# Patient Record
Sex: Male | Born: 1985 | Race: White | Hispanic: No | Marital: Single | Smoking: Current every day smoker
Health system: Southern US, Community
[De-identification: ages and names within clinical notes are randomized; demographics above are authoritative.]

## PROBLEM LIST (undated history)

## (undated) DIAGNOSIS — R479 Unspecified speech disturbances: Secondary | ICD-10-CM

## (undated) HISTORY — PX: HERNIA REPAIR: SHX51

---

## 2015-01-09 ENCOUNTER — Encounter (HOSPITAL_COMMUNITY): Payer: Self-pay

## 2015-01-09 ENCOUNTER — Emergency Department (HOSPITAL_COMMUNITY): Payer: Self-pay

## 2015-01-09 ENCOUNTER — Emergency Department (HOSPITAL_COMMUNITY)
Admission: EM | Admit: 2015-01-09 | Discharge: 2015-01-09 | Discharge status: Against Medical Advice | Payer: Self-pay | Attending: Emergency Medicine | Admitting: Emergency Medicine

## 2015-01-09 DIAGNOSIS — Z720 Tobacco use: Secondary | ICD-10-CM | POA: Insufficient documentation

## 2015-01-09 DIAGNOSIS — R52 Pain, unspecified: Secondary | ICD-10-CM

## 2015-01-09 DIAGNOSIS — N5089 Other specified disorders of the male genital organs: Secondary | ICD-10-CM | POA: Insufficient documentation

## 2015-01-09 LAB — URINALYSIS, ROUTINE W REFLEX MICROSCOPIC
Bilirubin Urine: NEGATIVE
GLUCOSE, UA: NEGATIVE mg/dL
Hgb urine dipstick: NEGATIVE
KETONES UR: NEGATIVE mg/dL
LEUKOCYTES UA: NEGATIVE
NITRITE: NEGATIVE
PH: 6.5 (ref 5.0–8.0)
Protein, ur: NEGATIVE mg/dL
SPECIFIC GRAVITY, URINE: 1.02 (ref 1.005–1.030)
Urobilinogen, UA: 1 mg/dL (ref 0.0–1.0)

## 2015-01-09 NOTE — ED Notes (Signed)
This nurse and Majel Homer, RN in room during assessment of pt

## 2015-01-09 NOTE — ED Notes (Signed)
Pt states that he ride will have to leave at 2200

## 2015-01-09 NOTE — ED Provider Notes (Signed)
CSN: [id number]     Arrival date & time 01/09/15  1913 History   First MD Initiated Contact with Patient 01/09/15 1935     Chief Complaint  Patient presents with  . Testicle Pain     HPI Pt was seen at 1935. Per pt, c/o gradual onset and persistence of constant scrotal "pain" for the past 2 days. Pt states he "feels a bump" in his lower scrotal area. Denies injury, no open wounds, no dysuria/hematuria, no rash, no fevers, no abd pain, no back pain.    History reviewed. No pertinent past medical history.   Past Surgical History  Procedure Laterality Date  . Hernia repair      Social History  Substance Use Topics  . Smoking status: Current Some Day Smoker -- 0.50 packs/day    Types: Cigarettes  . Smokeless tobacco: None  . Alcohol Use: No    Review of Systems ROS: Statement: All systems negative except as marked or noted in the HPI; Constitutional: Negative for fever and chills. ; ; Eyes: Negative for eye pain, redness and discharge. ; ; ENMT: Negative for ear pain, hoarseness, nasal congestion, sinus pressure and sore throat. ; ; Cardiovascular: Negative for chest pain, palpitations, diaphoresis, dyspnea and peripheral edema. ; ; Respiratory: Negative for cough, wheezing and stridor. ; ; Gastrointestinal: Negative for nausea, vomiting, diarrhea, abdominal pain, blood in stool, hematemesis, jaundice and rectal bleeding. . ; ; Genitourinary: Negative for dysuria, flank pain and hematuria. ; ; Genital:  No penile drainage or rash, no testicular pain or swelling, no scrotal rash, +scrotal pain and swelling. ;; Musculoskeletal: Negative for back pain and neck pain. Negative for swelling and trauma.; ; Skin: Negative for pruritus, rash, abrasions, blisters, bruising and skin lesion.; ; Neuro: Negative for headache, lightheadedness and neck stiffness. Negative for weakness, altered level of consciousness , altered mental status, extremity weakness, paresthesias, involuntary movement, seizure and  syncope.      Allergies  Review of patient's allergies indicates no known allergies.  Home Medications   Prior to Admission medications   Not on File   BP 131/69 mmHg  Pulse 109  Temp(Src) 100.6 F (38.1 C) (Oral)  Resp 16  Ht  (1.854 m)  Wt 245 lb 9 oz (111.386 kg)  BMI 32.40 kg/m2  SpO2 96%  BP 118/63 mmHg  Pulse 99  Temp(Src) 98.4 F (36.9 C) (Oral)  Resp 18  Ht  (1.854 m)  Wt 245 lb 9 oz (111.386 kg)  BMI 32.40 kg/m2  SpO2 93%   Physical Exam  1940: Physical examination:  Nursing notes reviewed; Vital signs and O2 SAT reviewed;  Constitutional: Well developed, Well nourished, Well hydrated, In no acute distress; Head:  Normocephalic, atraumatic; Eyes: EOMI, PERRL, No scleral icterus; ENMT: Mouth and pharynx normal, Mucous membranes moist; Neck: Supple, Full range of motion, No lymphadenopathy; Cardiovascular: Regular rate and rhythm, No murmur, rub, or gallop; Respiratory: Breath sounds clear & equal bilaterally, No rales, rhonchi, wheezes.  Speaking full sentences with ease, Normal respiratory effort/excursion; Chest: Nontender, Movement normal; Abdomen: Soft, Nontender, Nondistended, Normal bowel sounds; Genitourinary: No CVA tenderness. Genital exam performed with pt permission and male ED Tech chaperone present during exam.  No perineal erythema, ecchymosis, or soft tissue crepitus. +small superficial flaccid blister to inner upper right thigh, no drainage.  No penile lesions or drainage.  No scrotal erythema. +approximately 4x4cm diameter tender hard non-fluctuant mass palp at lower-posterior scrotum, no erythema, no ecchymosis, no central pointing area, no  open wounds, no blisters/vesicles. Normal testicular lie.  No testicular tenderness to palp.  +cremasteric reflexes bilat.  No inguinal LAN or palpable masses.;; Extremities: Pulses normal, No tenderness, No edema, No calf edema or asymmetry.; Neuro: AA&Ox3, Major CN grossly intact.  Speech clear. No gross  focal motor or sensory deficits in extremities. Climbs on and off stretcher easily by himself. Gait steady.; Skin: Color normal, Warm, Dry.   ED Course  Procedures (including critical care time) Labs Review   Imaging Review  I have personally reviewed and evaluated these images and lab results as part of my medical decision-making.   EKG Interpretation None      MDM  MDM Reviewed: previous chart, nursing note and vitals Interpretation: labs and ultrasound      2135: Pt refuses to stay for [place], stating his "ride is about to leave." ED RN and I both encouraged pt to stay for scrotal US; pt refuses to stay.  Pt makes his own medical decisions.  Risks of AMA explained to pt, including, but not limited to:  Testicular mass/cancer, testicular torsion, scrotal mass, stroke, heart attack, cardiac arrythmia ("irregular heart rate/beat"), "passing out," temporary and/or permanent disability, death.  Pt and family verb understanding and continue to refuse admission, understanding the consequences of his decision.  I encouraged pt to follow up with his PMD tomorrow and return to the ED immediately if symptoms worsen, he changes his mind, or for any other concerns.  Pt verb understanding, agreeable.     Samuel Jester, DO 01/13/15 1745

## 2015-01-09 NOTE — ED Notes (Signed)
Patient c/o testicular pain X2 days.

## 2015-01-09 NOTE — ED Notes (Signed)
Pt left due to not being able to wait for exam results.  Dr. Clarene Duke made aware and pt to leave AMA. Pt left with steady gait and stated no pain at present and will come back if it gets worse.

## 2015-01-13 LAB — GC/CHLAMYDIA PROBE AMP (~~LOC~~) NOT AT ARMC
CHLAMYDIA, DNA PROBE: NEGATIVE
NEISSERIA GONORRHEA: NEGATIVE

## 2017-03-06 ENCOUNTER — Other Ambulatory Visit: Payer: Self-pay

## 2017-03-06 ENCOUNTER — Emergency Department (HOSPITAL_COMMUNITY)
Admission: EM | Admit: 2017-03-06 | Discharge: 2017-03-06 | Disposition: A | Discharge status: Home / Self Care | Payer: Self-pay | Attending: Emergency Medicine | Admitting: Emergency Medicine

## 2017-03-06 ENCOUNTER — Encounter (HOSPITAL_COMMUNITY): Payer: Self-pay | Admitting: Emergency Medicine

## 2017-03-06 DIAGNOSIS — F17210 Nicotine dependence, cigarettes, uncomplicated: Secondary | ICD-10-CM | POA: Insufficient documentation

## 2017-03-06 DIAGNOSIS — L02811 Cutaneous abscess of head [any part, except face]: Secondary | ICD-10-CM | POA: Insufficient documentation

## 2017-03-06 DIAGNOSIS — L03811 Cellulitis of head [any part, except face]: Secondary | ICD-10-CM | POA: Insufficient documentation

## 2017-03-06 MED ORDER — SULFAMETHOXAZOLE-TRIMETHOPRIM 800-160 MG PO TABS
1.0000 | ORAL_TABLET | Freq: Once | ORAL | Status: AC
  Administered 2017-03-06: 1 via ORAL
  Filled 2017-03-06: qty 1

## 2017-03-06 MED ORDER — SULFAMETHOXAZOLE-TRIMETHOPRIM 800-160 MG PO TABS: 1.0000 | ORAL_TABLET | Freq: Two times a day (BID) | ORAL | 0 refills | Status: AC

## 2017-03-06 NOTE — ED Triage Notes (Signed)
Patient c/o abscess to right upper forehead x5 days. Patient states "It started as a pimple and I squeezed it." Patient does reports some drainage. Are now red with scabbed area. Patient reports pain radiating from forehead to back of head.

## 2017-03-06 NOTE — Discharge Instructions (Signed)
Take 1 tablet of Bactrim twice daily for the next 7 days.  Your first dose has been given in the emergency department.  Please take your next dose tomorrow morning.  Please return to the emergency department, urgent care, or if you were able to make an appointment with a primary care doctor, please follow-up for a recheck of your wound in the next 2-3 days.  Keep the area clean daily with warm soap and water.  You can apply warm compresses for 15-20 minutes up to 3-4 times a day to help the area drain.  For pain, you can also apply ice or cool compress, or take 600 mg of ibuprofen with food every 6-8 hours, or 650 mg of Tylenol every 6 hours for pain control.

## 2017-03-06 NOTE — ED Provider Notes (Signed)
[facility] EMERGENCY DEPARTMENT Provider Note   CSN: [id number] Arrival date & time: 03/06/17  1617     History   Chief Complaint Chief Complaint  Patient presents with  . Abscess    HPI Timothy Barrera is a 31 y.o. male who presents to the emergency department with a chief complaint of wound.  The patient reports that a "pimple" appeared on his right forehead approximately 5 days ago that he popped.  He reports that over the last 5 days that the area has gotten more swollen and a small amount of purulent drainage was noted from the area. The surrounding area also became increasingly red.  No fever or chills.  No history of DM.  No recent antibiotic use.  No treatment prior to arrival.  He also complains of pain to the right forehead that radiates across the top of the scalp.  He denies otalgia, neck pain, or stiffness.  He reports that he is currently unemployed and does not have a PCP or medical insurance.  He is a current every day 0.5 pack/day smoker.   The history is provided by the patient. No language interpreter was used.  Abscess  Location:  Head/neck Size:  0.4 cm Abscess quality: draining, induration, painful, redness and warmth   Duration:  5 days Pain details:    Quality:  Aching   Severity:  Moderate   Duration:  5 days   Timing:  Constant   Progression:  Worsening Chronicity:  New Context: not diabetes and not immunosuppression   Relieved by:  Nothing Worsened by:  Nothing Associated symptoms: no fever     History reviewed. No pertinent past medical history.  There are no active problems to display for this patient.   Past Surgical History:  Procedure Laterality Date  . HERNIA REPAIR         Home Medications    Prior to Admission medications   Medication Sig Start Date End Date Taking? Authorizing Provider  sulfamethoxazole-trimethoprim (BACTRIM DS,SEPTRA DS) 800-160 MG tablet Take 1 tablet by mouth 2 (two) times daily for 7 days. 03/06/17  03/13/17  Darnice Comrie, Coral ElseMia A, PA-C    Family History History reviewed. No pertinent family history.  Social History Social History   Tobacco Use  . Smoking status: Current Some Day Smoker    Packs/day: 0.50    Types: Cigarettes  . Smokeless tobacco: Never Used  Substance Use Topics  . Alcohol use: No  . Drug use: No     Allergies   Patient has no known allergies.   Review of Systems Review of Systems  Constitutional: Negative for chills and fever.  Skin: Positive for color change and wound.     Physical Exam Updated Vital Signs BP (!) 159/91 (BP Location: Left Arm)   Pulse 99   Temp 98.5 F (36.9 C) (Oral)   Resp 18   Wt 112.5 kg (248 lb)   SpO2 94%   BMI 32.72 kg/m   Physical Exam  Constitutional: He appears well-developed.  HENT:  Head: Normocephalic.  There is an approximately 0.4 cm area of induration with minimal fluctuance that is surrounded in a circular area by redness and mild warmth.  Please see picture below.  No periorbital involvement.  Minimal purulent drainage is actively expressed.  Eyes: Conjunctivae are normal.  Neck: Neck supple.  Cardiovascular: Normal rate and regular rhythm.  No murmur heard. Pulmonary/Chest: Effort normal.  Abdominal: Soft. He exhibits no distension.  Neurological: He is alert.  Skin:  Skin is warm and dry.  Psychiatric: His behavior is normal.  Nursing note and vitals reviewed.      ED Treatments / Results  Labs (all labs ordered are listed, but only abnormal results are displayed) Labs Reviewed - No data to display  EKG  EKG Interpretation None       Radiology No results found.  Procedures Procedures (including critical care time)  Medications Ordered in ED Medications  sulfamethoxazole-trimethoprim (BACTRIM DS,SEPTRA DS) 800-160 MG per tablet 1 tablet (not administered)     Initial Impression / Assessment and Plan / ED Course  I have reviewed the triage vital signs and the nursing  notes.  Pertinent labs & imaging results that were available during my care of the patient were reviewed by me and considered in my medical decision making (see chart for details).     Patient with skin abscess and surrounding cellulitis. The wound has already began to actively drain so will defer I&D at this time.   Encouraged home warm soaks and flushing.  The patient does not currently have medical insurance or PCP and is concerned about the cost of the antibiotic.  Will send the patient home with a week course of Bactrim and a good Rx coupon to ensure that the patient will be compliant with antibiotics as he said he can afford the cost of this medication.  Wound recheck in 2 days in the ED since he does not have a PCP.Will d/c to home.  Strict return precautions given.  No acute distress.  The patient is safe for discharge at this time.   Final Clinical Impressions(s) / ED Diagnoses   Final diagnoses:  Cellulitis and abscess of head    ED Discharge Orders        Ordered    sulfamethoxazole-trimethoprim (BACTRIM DS,SEPTRA DS) 800-160 MG tablet  2 times daily     03/06/17 1652       Loxley Cibrian, Coral ElseMia A, PA-C 03/06/17 1700    Mesner, Barbara CowerJason, MD 03/06/17 2328

## 2017-04-15 DIAGNOSIS — R1319 Other dysphagia: Secondary | ICD-10-CM | POA: Insufficient documentation

## 2017-04-15 DIAGNOSIS — F17210 Nicotine dependence, cigarettes, uncomplicated: Secondary | ICD-10-CM | POA: Insufficient documentation

## 2017-04-16 ENCOUNTER — Emergency Department (HOSPITAL_COMMUNITY)
Admission: EM | Admit: 2017-04-16 | Discharge: 2017-04-16 | Disposition: A | Discharge status: Home / Self Care | Payer: Self-pay | Attending: Emergency Medicine | Admitting: Emergency Medicine

## 2017-04-16 ENCOUNTER — Emergency Department (HOSPITAL_COMMUNITY): Payer: Self-pay

## 2017-04-16 ENCOUNTER — Other Ambulatory Visit: Payer: Self-pay

## 2017-04-16 ENCOUNTER — Encounter (HOSPITAL_COMMUNITY): Payer: Self-pay | Admitting: *Deleted

## 2017-04-16 DIAGNOSIS — R1319 Other dysphagia: Secondary | ICD-10-CM

## 2017-04-16 MED ORDER — GI COCKTAIL ~~LOC~~
30.0000 mL | Freq: Once | ORAL | Status: AC
  Administered 2017-04-16: 30 mL via ORAL
  Filled 2017-04-16: qty 30

## 2017-04-16 NOTE — ED Notes (Signed)
Pt says it feels like the symptoms are subsided

## 2017-04-16 NOTE — ED Triage Notes (Signed)
Pt c/o feeling like something is stuck in his throat; pt has tried drinking and gagging with no results

## 2017-04-16 NOTE — ED Notes (Signed)
Gave patient a cup of water, patient drank the entire cup without cough, spitting,  or noting of any difficulty swallowing. The patient says he feels like the water is getting stuck.

## 2017-04-16 NOTE — ED Provider Notes (Signed)
[facility] EMERGENCY DEPARTMENT Provider Note   CSN: [id number] Arrival date & time: 04/15/17  2359     History   Chief Complaint Chief Complaint  Patient presents with  . Airway Obstruction    HPI Timothy Barrera is a 32 y.o. male.  The history is provided by the patient.  Illness  This is a new problem. The current episode started 6 to 12 hours ago. The problem occurs constantly. The problem has not changed since onset.Pertinent negatives include no chest pain and no shortness of breath. The symptoms are aggravated by swallowing. Nothing relieves the symptoms. He has tried nothing for the symptoms.  Patient reports eating a dinner that was consistent of burritos, and then several hours later had chicken potatoes and apple pie and soon after noted that he had difficulty swallowing He felt like something "stuck " However he is able to handle his secretions There is no fevers or vomiting, no neck pain He had otherwise been well prior to this episode   PMH-none Past Surgical History:  Procedure Laterality Date  . HERNIA REPAIR         Home Medications    Prior to Admission medications   Not on File    Family History History reviewed. No pertinent family history.  Social History Social History   Tobacco Use  . Smoking status: Current Some Day Smoker    Packs/day: 0.50    Types: Cigarettes  . Smokeless tobacco: Never Used  Substance Use Topics  . Alcohol use: No  . Drug use: No     Allergies   Patient has no known allergies.   Review of Systems Review of Systems  Constitutional: Negative for fever.  HENT: Positive for trouble swallowing. Negative for drooling.   Respiratory: Negative for shortness of breath.   Cardiovascular: Negative for chest pain.  Gastrointestinal: Negative for vomiting.  All other systems reviewed and are negative.    Physical Exam Updated Vital Signs BP 125/82   Pulse 88   Temp 98.7 F (37.1 C) (Oral)   Resp 16    Ht 1.854 m (6\' 1" )   Wt 102.1 kg (225 lb)   SpO2 94%   BMI 29.69 kg/m   Physical Exam CONSTITUTIONAL: Disheveled, no acute distress HEAD: Normocephalic/atraumatic EYES: EOMI/PERRL ENMT: Mucous membranes moist, nasal sounding voice noted but no stridor, no hot potato voice, no drooling noted, he is handling secretions well, uvula midline without erythema/exudates NECK: supple no meningeal signs, no crepitus to neck, no tenderness, no edema SPINE/BACK:entire spine nontender CV: S1/S2 noted, no murmurs/rubs/gallops noted LUNGS: Lungs are clear to auscultation bilaterally, no apparent distress ABDOMEN: soft NEURO: Pt is awake/alert/appropriate, moves all extremitiesx4.  No facial droop.   EXTREMITIES:  full ROM SKIN: warm, color normal PSYCH: no abnormalities of mood noted, alert and oriented to situation   ED Treatments / Results  Labs (all labs ordered are listed, but only abnormal results are displayed) Labs Reviewed - No data to display  EKG  EKG Interpretation None       Radiology Dg Neck Soft Tissue  Result Date: 04/16/2017 CLINICAL DATA:  Feels like something is stuck in the throat EXAM: NECK SOFT TISSUES - 1+ VIEW COMPARISON:  None. FINDINGS: There is no evidence of retropharyngeal soft tissue swelling or epiglottic enlargement. The cervical airway is unremarkable and no radio-opaque foreign body identified. IMPRESSION: Negative. Electronically Signed   By: Jasmine Pang M.D.   On: 04/16/2017 01:17    Procedures Procedures (including critical  care time)  Medications Ordered in ED Medications  gi cocktail (Maalox,Lidocaine,Donnatal) (30 mLs Oral Given 04/16/17 0329)     Initial Impression / Assessment and Plan / ED Course  I have reviewed the triage vital signs and the nursing notes.  Pertinent  imaging results that were available during my care of the patient were reviewed by me and considered in my medical decision making (see chart for details).     4:22  AM Patient monitored, no distress, was able to drink fluids without any difficulty He reports feeling improved.  He reports his voice sounds normal for him At this point there are no signs of acute esophageal obstruction Refer to GI We discussed strict ER return precautions  Final Clinical Impressions(s) / ED Diagnoses   Final diagnoses:  Other dysphagia    ED Discharge Orders    None       Zadie RhineWickline, Chloris Marcoux, MD 04/16/17 [phone]

## 2017-04-19 ENCOUNTER — Emergency Department (HOSPITAL_COMMUNITY)
Admission: EM | Admit: 2017-04-19 | Discharge: 2017-04-19 | Disposition: A | Discharge status: Home / Self Care | Payer: Self-pay

## 2017-04-19 NOTE — ED Notes (Signed)
Called x1, no answer

## 2017-04-19 NOTE — ED Notes (Signed)
Called x2

## 2017-04-20 ENCOUNTER — Other Ambulatory Visit: Payer: Self-pay

## 2017-04-20 ENCOUNTER — Emergency Department (HOSPITAL_COMMUNITY): Payer: Self-pay

## 2017-04-20 ENCOUNTER — Emergency Department (HOSPITAL_COMMUNITY)
Admission: EM | Admit: 2017-04-20 | Discharge: 2017-04-21 | Disposition: A | Discharge status: Home / Self Care | Payer: Self-pay | Attending: Emergency Medicine | Admitting: Emergency Medicine

## 2017-04-20 ENCOUNTER — Encounter (HOSPITAL_COMMUNITY): Payer: Self-pay | Admitting: Emergency Medicine

## 2017-04-20 DIAGNOSIS — F17210 Nicotine dependence, cigarettes, uncomplicated: Secondary | ICD-10-CM | POA: Insufficient documentation

## 2017-04-20 DIAGNOSIS — R0989 Other specified symptoms and signs involving the circulatory and respiratory systems: Secondary | ICD-10-CM | POA: Insufficient documentation

## 2017-04-20 LAB — BASIC METABOLIC PANEL
Anion gap: 12 (ref 5–15)
BUN: 9 mg/dL (ref 6–20)
CHLORIDE: 100 mmol/L — AB (ref 101–111)
CO2: 28 mmol/L (ref 22–32)
Calcium: 9.6 mg/dL (ref 8.9–10.3)
Creatinine, Ser: 0.62 mg/dL (ref 0.61–1.24)
GFR calc Af Amer: >60 mL/min (ref >60)
GFR calc non Af Amer: >60 mL/min (ref >60)
GLUCOSE: 86 mg/dL (ref 65–99)
POTASSIUM: 4.3 mmol/L (ref 3.5–5.1)
Sodium: 140 mmol/L (ref 135–145)

## 2017-04-20 LAB — CBC WITH DIFFERENTIAL/PLATELET
Basophils Absolute: 0 10*3/uL (ref 0.0–0.1)
Basophils Relative: 0 %
EOS PCT: 1 %
Eosinophils Absolute: 0.1 10*3/uL (ref 0.0–0.7)
HCT: 49.8 % (ref 39.0–52.0)
Hemoglobin: 16.7 g/dL (ref 13.0–17.0)
LYMPHS ABS: 1.6 10*3/uL (ref 0.7–4.0)
LYMPHS PCT: 22 %
MCH: 29.8 pg (ref 26.0–34.0)
MCHC: 33.5 g/dL (ref 30.0–36.0)
MCV: 88.8 fL (ref 78.0–100.0)
MONO ABS: 0.5 10*3/uL (ref 0.1–1.0)
Monocytes Relative: 7 %
Neutro Abs: 5 10*3/uL (ref 1.7–7.7)
Neutrophils Relative %: 70 %
PLATELETS: 163 10*3/uL (ref 150–400)
RBC: 5.61 MIL/uL (ref 4.22–5.81)
RDW: 13.4 % (ref 11.5–15.5)
WBC: 7.2 10*3/uL (ref 4.0–10.5)

## 2017-04-20 MED ORDER — GI COCKTAIL ~~LOC~~
30.0000 mL | Freq: Once | ORAL | Status: AC
  Administered 2017-04-20: 30 mL via ORAL
  Filled 2017-04-20: qty 30

## 2017-04-20 MED ORDER — IOPAMIDOL (ISOVUE-300) INJECTION 61%
75.0000 mL | Freq: Once | INTRAVENOUS | Status: AC | PRN
  Administered 2017-04-20: 75 mL via INTRAVENOUS

## 2017-04-20 MED ORDER — RANITIDINE HCL 150 MG/10ML PO SYRP
150.0000 mg | ORAL_SOLUTION | Freq: Once | ORAL | Status: AC
  Administered 2017-04-21: 150 mg via ORAL
  Filled 2017-04-20 (×2): qty 10

## 2017-04-20 NOTE — ED Triage Notes (Signed)
Patient states he was seen here on the 12th for food lodged in his throat. Pt states his symptoms have not changed and he has no money to see a specialist.

## 2017-04-20 NOTE — ED Notes (Signed)
Patient transported to CT 

## 2017-04-20 NOTE — ED Provider Notes (Signed)
[facility] EMERGENCY DEPARTMENT Provider Note   CSN: [id number] Arrival date & time: 04/20/17  1632   History   Chief Complaint Chief Complaint  Patient presents with  . Anorexia    HPI Timothy Barrera is a 32 y.o. male who presents with foreign body sensation in the throat. PMH significant for speech impediment. He states that he has been having this problem for the past week. History is limited due to speech impediment and it difficult to understand the patient at times. He was originally seen on Jan 12th for possible food impaction. He developed difficulty swallowing after eating a meal and then felt like something was stuck. Plain films of the neck were unremarkable. He was able to tolerate PO. He went home and was afraid to eat but when he did he developed the sensation again. He denies pain, just the feeling of something stuck. He also had some epigastric pain but this has resolved. He came back to the ED on 1/15 but LWBS due to wait times. Tonight he tried to eat again and developed the sensation so came back to the ED. He reports not being able to follow up with GI because he doesn't have insurance.  HPI  History reviewed. No pertinent past medical history.  There are no active problems to display for this patient.   Past Surgical History:  Procedure Laterality Date  . HERNIA REPAIR         Home Medications    Prior to Admission medications   Not on File    Family History History reviewed. No pertinent family history.  Social History Social History   Tobacco Use  . Smoking status: Current Some Day Smoker    Packs/day: 0.50    Types: Cigarettes  . Smokeless tobacco: Never Used  Substance Use Topics  . Alcohol use: Yes  . Drug use: No     Allergies   Patient has no known allergies.   Review of Systems Review of Systems  Constitutional: Negative for fever.  HENT: Negative for drooling, trouble swallowing and voice change.        +FB sensation    Respiratory: Negative for shortness of breath.   Gastrointestinal: Positive for abdominal pain (resolved). Negative for nausea and vomiting.  All other systems reviewed and are negative.    Physical Exam Updated Vital Signs BP 139/88 (BP Location: Left Arm)   Pulse 80   Temp 97.7 F (36.5 C) (Oral)   Resp 16   Ht 6\' 1"  (1.854 m)   Wt 104.3 kg (230 lb)   SpO2 98%   BMI 30.34 kg/m   Physical Exam  Constitutional: He is oriented to person, place, and time. He appears well-developed and well-nourished. No distress.  No distress. Patient has a speech impediment at baseline  HENT:  Head: Normocephalic and atraumatic.  Mouth/Throat: Uvula is midline, oropharynx is clear and moist and mucous membranes are normal.  Eyes: Conjunctivae are normal. Pupils are equal, round, and reactive to light. Right eye exhibits no discharge. Left eye exhibits no discharge. No scleral icterus.  Neck: Normal range of motion.  Cardiovascular: Normal rate and regular rhythm. Exam reveals no gallop and no friction rub.  No murmur heard. Pulmonary/Chest: Effort normal and breath sounds normal. No stridor. No respiratory distress. He has no wheezes. He has no rales. He exhibits no tenderness.  Abdominal: He exhibits no distension.  Neurological: He is alert and oriented to person, place, and time.  Skin: Skin is warm and  dry.  Psychiatric: He has a normal mood and affect. His behavior is normal.  Nursing note and vitals reviewed.    ED Treatments / Results  Labs (all labs ordered are listed, but only abnormal results are displayed) Labs Reviewed  BASIC METABOLIC PANEL - Abnormal; Notable for the following components:      Result Value   Chloride 100 (*)    All other components within normal limits  CBC WITH DIFFERENTIAL/PLATELET    EKG  EKG Interpretation None       Radiology Ct Soft Tissue Neck W Contrast  Result Date: 04/20/2017 CLINICAL DATA:  32 y/o M; foreign body ingestion on  04/16/2017 with persistent symptoms and neck pain. EXAM: CT NECK WITH CONTRAST TECHNIQUE: Multidetector CT imaging of the neck was performed using the standard protocol following the bolus administration of intravenous contrast. CONTRAST:  75mL ISOVUE-300 IOPAMIDOL (ISOVUE-300) INJECTION 61% COMPARISON:  04/16/2017 neck radiographs FINDINGS: Pharynx and larynx: Normal. No mass or swelling. No foreign body identified. Salivary glands: No inflammation, mass, or stone. Thyroid: Normal. Lymph nodes: None enlarged or abnormal density. Vascular: Negative. Limited intracranial: Negative. Visualized orbits: Negative. Mastoids and visualized paranasal sinuses: Partial left mastoid air cell opacification. Otherwise negative. Skeleton: No acute or aggressive process. Upper chest: Negative. Other: None. IMPRESSION: No foreign body identified in the aerodigestive tract. No significant inflammatory changes or exophytic mass. Unremarkable CT of the neck. Electronically Signed   By: Mitzi HansenLance  Furusawa-Stratton M.D.   On: 04/20/2017 23:42    Procedures Procedures (including critical care time)  Medications Ordered in ED Medications  gi cocktail (Maalox,Lidocaine,Donnatal) (30 mLs Oral Given 04/20/17 2248)  ranitidine (ZANTAC) 150 MG/10ML syrup 150 mg (150 mg Oral Given 04/21/17 0010)  iopamidol (ISOVUE-300) 61 % injection 75 mL (75 mLs Intravenous Contrast Given 04/20/17 2319)     Initial Impression / Assessment and Plan / ED Course  I have reviewed the triage vital signs and the nursing notes.  Pertinent labs & imaging results that were available during my care of the patient were reviewed by me and considered in my medical decision making (see chart for details).  32 year old male with foreign body sensation in the throat.  Unclear etiology.  Vital signs are normal.  He can talk without difficulty although he does have a speech impediment at baseline.  Labs are normal.  CT is normal.  He is given a GI cocktail and  Zantac once again which improved his symptoms however he is afraid that it will come back and he is anxious about a definitive diagnosis.  I advised him to attempt a trial of over-the-counter acid reflux medicine for the next week and to call GI to set up an appointment and request financial assistance.  Final Clinical Impressions(s) / ED Diagnoses   Final diagnoses:  Foreign body sensation in throat    ED Discharge Orders    None       Bethel BornGekas, Imogine Carvell Marie, PA-C 04/21/17 [id number]    Bethann BerkshireZammit, Joseph, MD 04/21/17 [id number]

## 2017-04-20 NOTE — ED Notes (Signed)
Pt states he wants to eat and drink fluids but can't due to the feeling that something is stuck in his throat. Pt states this began this afternoon after he ate lunch.

## 2017-04-21 NOTE — Discharge Instructions (Signed)
Please follow up with GI Take an over the counter acid reflux medicine for the next week. Talk to the pharmacist to help you find one that is in liquid form

## 2017-04-24 ENCOUNTER — Emergency Department (HOSPITAL_COMMUNITY)
Admission: EM | Admit: 2017-04-24 | Discharge: 2017-04-24 | Disposition: A | Discharge status: Home / Self Care | Payer: Self-pay | Attending: Emergency Medicine | Admitting: Emergency Medicine

## 2017-04-24 ENCOUNTER — Encounter (HOSPITAL_COMMUNITY): Payer: Self-pay | Admitting: *Deleted

## 2017-04-24 DIAGNOSIS — R198 Other specified symptoms and signs involving the digestive system and abdomen: Secondary | ICD-10-CM

## 2017-04-24 DIAGNOSIS — R0989 Other specified symptoms and signs involving the circulatory and respiratory systems: Secondary | ICD-10-CM | POA: Insufficient documentation

## 2017-04-24 DIAGNOSIS — K228 Other specified diseases of esophagus: Secondary | ICD-10-CM

## 2017-04-24 DIAGNOSIS — F17210 Nicotine dependence, cigarettes, uncomplicated: Secondary | ICD-10-CM | POA: Insufficient documentation

## 2017-04-24 MED ORDER — LORAZEPAM 1 MG PO TABS
1.0000 mg | ORAL_TABLET | Freq: Once | ORAL | Status: DC
  Filled 2017-04-24: qty 1

## 2017-04-24 MED ORDER — PANTOPRAZOLE SODIUM 40 MG PO TBEC: 40.0000 mg | DELAYED_RELEASE_TABLET | Freq: Every day | ORAL | 0 refills | Status: DC

## 2017-04-24 MED ORDER — GI COCKTAIL ~~LOC~~
30.0000 mL | Freq: Once | ORAL | Status: AC
  Administered 2017-04-24: 30 mL via ORAL
  Filled 2017-04-24: qty 30

## 2017-04-24 MED ORDER — PANTOPRAZOLE SODIUM 40 MG PO TBEC
40.0000 mg | DELAYED_RELEASE_TABLET | Freq: Once | ORAL | Status: DC
  Filled 2017-04-24: qty 1

## 2017-04-24 NOTE — ED Provider Notes (Signed)
[facility] Provider Note   CSN: [id number] Arrival date & time: 04/24/17  0400     History   Chief Complaint Chief Complaint  Patient presents with  . Foreign Body    HPI Timothy Barrera is a 32 y.o. male.  The history is provided by the patient.  Foreign Body   He had onset about 1 hour ago of a foreign body sensation in his throat.  He states that when he coughed up some mucus, it felt like the foreign body moved.  He is able to swallow.  He denies swallowing anything.  He had been seen in emergency department twice in the last week for similar complaints and was referred to gastroenterology.  He states he is not had an opportunity to call for a follow-up appointment, but does state that the symptoms had gone away.  He says tonight's episode feels different.  History reviewed. No pertinent past medical history.  There are no active problems to display for this patient.   Past Surgical History:  Procedure Laterality Date  . HERNIA REPAIR         Home Medications    Prior to Admission medications   Not on File    Family History No family history on file.  Social History Social History   Tobacco Use  . Smoking status: Current Some Day Smoker    Packs/day: 0.50    Types: Cigarettes  . Smokeless tobacco: Never Used  Substance Use Topics  . Alcohol use: Yes  . Drug use: No     Allergies   Patient has no known allergies.   Review of Systems Review of Systems  All other systems reviewed and are negative.    Physical Exam Updated Vital Signs BP 132/90   Pulse (!) 108   Temp 97.7 F (36.5 C) (Oral)   Resp 17   Ht 6\' 1"  (1.854 m)   Wt 104.3 kg (230 lb)   SpO2 97%   BMI 30.34 kg/m   Physical Exam  Nursing note and vitals reviewed.  32 year old male, resting comfortably and in no acute distress. Vital signs are normal. Oxygen saturation is 97%, which is normal. Head is normocephalic and atraumatic. PERRLA, EOMI.  Oropharynx is clear.  There is no pooling of secretions. Neck is nontender and supple without adenopathy or JVD.  There is no stridor. Back is nontender and there is no CVA tenderness. Lungs are clear without rales, wheezes, or rhonchi. Chest is nontender. Heart has regular rate and rhythm without murmur. Abdomen is soft, flat, nontender without masses or hepatosplenomegaly and peristalsis is normoactive. Extremities have no cyanosis or edema, full range of motion is present. Skin is warm and dry without rash. 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  ED Treatments / Results   Procedures Procedures (including critical care time)  Medications Ordered in ED Medications  pantoprazole (PROTONIX) EC tablet 40 mg (not administered)  LORazepam (ATIVAN) tablet 1 mg (not administered)  gi cocktail (Maalox,Lidocaine,Donnatal) (30 mLs Oral Given 04/24/17 0432)     Initial Impression / Assessment and Plan / ED Course  I have reviewed the triage vital signs and the nursing notes.  Foreign body sensation in throat.  Old records are reviewed, and he was seen on January 12 and January 16 for similar complaints and had negative plain  films of his neck and negative CT scan of his neck.  On each occasion, he was referred to gastroenterology.  At this point, I do not see indication for additional imaging.  He will be given a GI cocktail and reassessed.  He claims no relief from the GI cocktail.  However, he was able to swallow it without difficulty.  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  However, esophageal web or ring could cause similar symptoms.  I have encouraged him to make a follow-up appointment with gastroenterology to be considered for upper endoscopy.  In the meantime, he is discharged with prescription for  pantoprazole.  He is given a dose of pantoprazole here as well as a single dose of lorazepam.  Final Clinical Impressions(s) / ED Diagnoses   Final diagnoses:  Sensation of foreign body in esophagus    ED Discharge Orders        Ordered    pantoprazole (PROTONIX) 40 MG tablet  Daily     04/24/17 0511       Dione Booze, MD 04/24/17 [phone]

## 2017-04-24 NOTE — ED Notes (Signed)
ED Provider at bedside. 

## 2017-04-24 NOTE — Discharge Instructions (Signed)
Call the gastroenterologist tomorrow for an appointment as soon as possible. He can use an instrument to look down your esophagus to see why you keep having problems.

## 2017-04-24 NOTE — ED Notes (Signed)
Pt says he did not get the OTC reflux meds prescribed and has not yet had time to call the GI specialist.

## 2017-04-24 NOTE — ED Triage Notes (Signed)
Pt states he feels like he has something stuck in his throat. He has been here for the same previously, but this time he says "it feels like it is moving and causing sharp pain", started 30 minutes ago.

## 2017-05-12 ENCOUNTER — Emergency Department (HOSPITAL_COMMUNITY)
Admission: EM | Admit: 2017-05-12 | Discharge: 2017-05-12 | Disposition: A | Discharge status: Home / Self Care | Payer: Self-pay | Attending: Emergency Medicine | Admitting: Emergency Medicine

## 2017-05-12 ENCOUNTER — Other Ambulatory Visit: Payer: Self-pay

## 2017-05-12 ENCOUNTER — Encounter (HOSPITAL_COMMUNITY): Payer: Self-pay | Admitting: Emergency Medicine

## 2017-05-12 DIAGNOSIS — Z5321 Procedure and treatment not carried out due to patient leaving prior to being seen by health care provider: Secondary | ICD-10-CM | POA: Insufficient documentation

## 2017-05-12 DIAGNOSIS — R2241 Localized swelling, mass and lump, right lower limb: Secondary | ICD-10-CM | POA: Insufficient documentation

## 2017-05-12 NOTE — ED Triage Notes (Signed)
Pt not in waiting area at this time.  

## 2017-05-12 NOTE — ED Triage Notes (Signed)
Patient c/o swelling in right leg and foot. Denies any known injury. Per patient swelling in legs bilaterally "a couple of days ago" but now just in right leg. Denies and shortness of breath, hx of gout or blood clots. Patient states numbness.

## 2017-05-13 ENCOUNTER — Emergency Department (HOSPITAL_COMMUNITY)
Admission: EM | Admit: 2017-05-13 | Discharge: 2017-05-14 | Disposition: A | Discharge status: Home / Self Care | Payer: Self-pay | Attending: Emergency Medicine | Admitting: Emergency Medicine

## 2017-05-13 ENCOUNTER — Encounter (HOSPITAL_COMMUNITY): Payer: Self-pay

## 2017-05-13 ENCOUNTER — Emergency Department (HOSPITAL_COMMUNITY): Payer: Self-pay

## 2017-05-13 DIAGNOSIS — F17210 Nicotine dependence, cigarettes, uncomplicated: Secondary | ICD-10-CM | POA: Insufficient documentation

## 2017-05-13 DIAGNOSIS — Z79899 Other long term (current) drug therapy: Secondary | ICD-10-CM | POA: Insufficient documentation

## 2017-05-13 DIAGNOSIS — R609 Edema, unspecified: Secondary | ICD-10-CM

## 2017-05-13 DIAGNOSIS — M79671 Pain in right foot: Secondary | ICD-10-CM | POA: Insufficient documentation

## 2017-05-13 DIAGNOSIS — M79672 Pain in left foot: Secondary | ICD-10-CM | POA: Insufficient documentation

## 2017-05-13 DIAGNOSIS — R2243 Localized swelling, mass and lump, lower limb, bilateral: Secondary | ICD-10-CM | POA: Insufficient documentation

## 2017-05-13 NOTE — ED Triage Notes (Signed)
Pt reports bilateral foot pain and swelling over the past 5 days

## 2017-05-13 NOTE — ED Notes (Signed)
05/14/2107 14:33  Attempted follow-up call, no answer.

## 2017-05-13 NOTE — ED Provider Notes (Signed)
[facility]   CSN: [id number] Arrival date & time: 05/13/17  2319     History   Chief Complaint Chief Complaint  Patient presents with  . Foot Pain    Bilateral/Swelling    HPI Timothy Barrera is a 32 y.o. male.  Patient complains Of bilateral foot pain and swelling for the past 5 days.  Denies any falls or injuries.  Pain is on the top of his feet and worse when he tries to ambulate.  Denies any leg pain or leg swelling.  No chest pain or shortness of breath.  No history of blood clots.  No recent immobilization or travel.  Patient has tried Tylenol without relief.  Denies any history of fever, chills, nausea or vomiting.  Denies ever having this problem before.  Does not take any regular medications.  States he has had a rash on his legs and feet for the past year which is unchanged.   The history is provided by the patient.  Foot Pain  Pertinent negatives include no chest pain, no abdominal pain, no headaches and no shortness of breath.    History reviewed. No pertinent past medical history.  There are no active problems to display for this patient.   Past Surgical History:  Procedure Laterality Date  . HERNIA REPAIR         Home Medications    Prior to Admission medications   Medication Sig Start Date End Date Taking? Authorizing Provider  pantoprazole (PROTONIX) 40 MG tablet Take 1 tablet (40 mg total) by mouth daily. 04/24/17   Dione BoozeGlick, David, MD    Family History No family history on file.  Social History Social History   Tobacco Use  . Smoking status: Current Some Day Smoker    Packs/day: 0.50    Types: Cigarettes  . Smokeless tobacco: Never Used  Substance Use Topics  . Alcohol use: Yes  . Drug use: No     Allergies   Patient has no known allergies.   Review of Systems Review of Systems  Constitutional: Negative for activity change, appetite change and fever.  HENT: Negative for congestion and  rhinorrhea.   Eyes: Negative for visual disturbance.  Respiratory: Negative for chest tightness and shortness of breath.   Cardiovascular: Positive for leg swelling. Negative for chest pain.  Gastrointestinal: Negative for abdominal pain, nausea and vomiting.  Genitourinary: Negative for dysuria and hematuria.  Musculoskeletal: Positive for arthralgias, joint swelling and myalgias. Negative for back pain.  Skin: Positive for rash.  Neurological: Negative for dizziness, weakness and headaches.   all other systems are negative except as noted in the HPI and PMH.     Physical Exam Updated Vital Signs BP (!) 143/95 (BP Location: Right Arm)   Pulse 96   Temp 98.3 F (36.8 C) (Oral)   Resp 18   Ht 6\' 1"  (1.854 m)   Wt 105.2 kg (232 lb)   SpO2 93%   BMI 30.61 kg/m   Physical Exam  Constitutional: He is oriented to person, place, and time. He appears well-developed and well-nourished. No distress.  HENT:  Head: Normocephalic and atraumatic.  Mouth/Throat: Oropharynx is clear and moist. No oropharyngeal exudate.  Eyes: Conjunctivae and EOM are normal. Pupils are equal, round, and reactive to light.  Neck: Normal range of motion. Neck supple.  No meningismus.  Cardiovascular: Normal rate, regular rhythm, normal heart sounds and intact distal pulses.  No murmur heard. Pulmonary/Chest: Effort normal and breath sounds normal.  No respiratory distress.  Abdominal: Soft. There is no tenderness. There is no rebound and no guarding.  Musculoskeletal: Normal range of motion. He exhibits edema. He exhibits no tenderness.  Dorsal edema to both feet bilaterally.  Intact DP and PT pulses. Overlying skin is pale with scattered areas of brown rash appearing like petechiae. There is no calf tenderness or asymmetry Full range of motion of bilateral ankles, knees and toes  Neurological: He is alert and oriented to person, place, and time. No cranial nerve deficit. He exhibits normal muscle tone.  Coordination normal.  No ataxia on finger to nose bilaterally. No pronator drift. 5/5 strength throughout. CN 2-12 intact.Equal grip strength. Sensation intact.   Skin: Skin is warm.  Psychiatric: He has a normal mood and affect. His behavior is normal.  Nursing note and vitals reviewed.    ED Treatments / Results  Labs (all labs ordered are listed, but only abnormal results are displayed) Labs Reviewed  CBC WITH DIFFERENTIAL/PLATELET - Abnormal; Notable for the following components:      Result Value   Platelets 128 (*)    All other components within normal limits  COMPREHENSIVE METABOLIC PANEL - Abnormal; Notable for the following components:   Sodium 147 (*)    BUN 5 (*)    Creatinine, Ser 0.60 (*)    Total Protein 5.9 (*)    Albumin 3.3 (*)    AST 42 (*)    All other components within normal limits  URINALYSIS, ROUTINE W REFLEX MICROSCOPIC  BRAIN NATRIURETIC PEPTIDE  D-DIMER, QUANTITATIVE (NOT AT [facility])    EKG  EKG Interpretation None       Radiology Dg Chest 2 View  Result Date: 05/14/2017 CLINICAL DATA:  32 y/o  M; bilateral foot swelling and hypoxia. EXAM: CHEST  2 VIEW COMPARISON:  None. FINDINGS: The heart size and mediastinal contours are within normal limits. Both lungs are clear. The visualized skeletal structures are unremarkable. Elevated right hemidiaphragm. IMPRESSION: No acute pulmonary process identified. Elevated right hemidiaphragm. Electronically Signed   By: Mitzi Hansen M.D.   On: 05/14/2017 00:34    Procedures Procedures (including critical care time)  Medications Ordered in ED Medications - No data to display   Initial Impression / Assessment and Plan / ED Course  I have reviewed the triage vital signs and the nursing notes.  Pertinent labs & imaging results that were available during my care of the patient were reviewed by me and considered in my medical decision making (see chart for details).    5 days of bilateral feet pain  and swelling. No Trauma. No SOB or CP.  Feet are neurovascularly intact with good pulses.  Low suspicion for DVT given bilateral swelling and no calf tenderness or asymmetry.  Creatinine and LFTs are reassuring.  D-dimer is negative.  Chest x-ray is clear.  No evidence of heart failure or DVT.  No evidence of heart failure, DVT.  No proteinuria in urine.  No evidence of nephrotic syndrome.  The patient was given trial of Lasix for peripheral edema.  Instructed on leg elevation.  Needs to establish care with PCP.  Resource guide given.  Return precautions discussed.  Final Clinical Impressions(s) / ED Diagnoses   Final diagnoses:  Peripheral edema    ED Discharge Orders    None       Raylin Winer, Jeannett Senior, MD 05/14/17 [phone]

## 2017-05-13 NOTE — ED Notes (Signed)
05/13/2017, 15:07 Pt. Returned call.  Follow-up  Call completed.

## 2017-05-14 LAB — CBC WITH DIFFERENTIAL/PLATELET
Basophils Absolute: 0 10*3/uL (ref 0.0–0.1)
Basophils Relative: 0 %
Eosinophils Absolute: 0.2 10*3/uL (ref 0.0–0.7)
Eosinophils Relative: 3 %
HEMATOCRIT: 43.3 % (ref 39.0–52.0)
HEMOGLOBIN: 14 g/dL (ref 13.0–17.0)
LYMPHS ABS: 1.4 10*3/uL (ref 0.7–4.0)
LYMPHS PCT: 27 %
MCH: 29.4 pg (ref 26.0–34.0)
MCHC: 32.3 g/dL (ref 30.0–36.0)
MCV: 90.8 fL (ref 78.0–100.0)
MONOS PCT: 7 %
Monocytes Absolute: 0.3 10*3/uL (ref 0.1–1.0)
NEUTROS ABS: 3.2 10*3/uL (ref 1.7–7.7)
NEUTROS PCT: 63 %
Platelets: 128 10*3/uL — ABNORMAL LOW (ref 150–400)
RBC: 4.77 MIL/uL (ref 4.22–5.81)
RDW: 13 % (ref 11.5–15.5)
WBC: 5.1 10*3/uL (ref 4.0–10.5)

## 2017-05-14 LAB — COMPREHENSIVE METABOLIC PANEL
ALK PHOS: 82 U/L (ref 38–126)
ALT: 46 U/L (ref 17–63)
AST: 42 U/L — ABNORMAL HIGH (ref 15–41)
Albumin: 3.3 g/dL — ABNORMAL LOW (ref 3.5–5.0)
Anion gap: 9 (ref 5–15)
BILIRUBIN TOTAL: 0.8 mg/dL (ref 0.3–1.2)
BUN: 5 mg/dL — ABNORMAL LOW (ref 6–20)
CALCIUM: 8.9 mg/dL (ref 8.9–10.3)
CO2: 30 mmol/L (ref 22–32)
CREATININE: 0.6 mg/dL — AB (ref 0.61–1.24)
Chloride: 108 mmol/L (ref 101–111)
GFR calc non Af Amer: >60 mL/min (ref >60)
Glucose, Bld: 89 mg/dL (ref 65–99)
Potassium: 3.5 mmol/L (ref 3.5–5.1)
SODIUM: 147 mmol/L — AB (ref 135–145)
TOTAL PROTEIN: 5.9 g/dL — AB (ref 6.5–8.1)

## 2017-05-14 LAB — URINALYSIS, ROUTINE W REFLEX MICROSCOPIC
BILIRUBIN URINE: NEGATIVE
GLUCOSE, UA: NEGATIVE mg/dL
Hgb urine dipstick: NEGATIVE
KETONES UR: NEGATIVE mg/dL
Leukocytes, UA: NEGATIVE
Nitrite: NEGATIVE
PH: 8 (ref 5.0–8.0)
PROTEIN: NEGATIVE mg/dL
Specific Gravity, Urine: 1.015 (ref 1.005–1.030)

## 2017-05-14 LAB — D-DIMER, QUANTITATIVE: D-Dimer, Quant: <0.27 ug/mL-FEU (ref 0.00–0.50)

## 2017-05-14 LAB — BRAIN NATRIURETIC PEPTIDE: B Natriuretic Peptide: 39 pg/mL (ref 0.0–100.0)

## 2017-05-14 MED ORDER — FUROSEMIDE 20 MG PO TABS: 20.0000 mg | ORAL_TABLET | Freq: Every day | ORAL | 0 refills | Status: DC

## 2017-05-14 NOTE — Discharge Instructions (Signed)
There is no evidence of heart failure or blood clot.  Your kidney function and liver function are normal.  Your legs elevated.  Take the fluid pills as prescribed.  Follow-up with your primary doctor.  Return to the ED if you develop chest pain, shortness of breath or any other concerns.

## 2017-05-18 ENCOUNTER — Encounter (HOSPITAL_COMMUNITY): Payer: Self-pay | Admitting: Emergency Medicine

## 2017-05-18 ENCOUNTER — Emergency Department (HOSPITAL_COMMUNITY)
Admission: EM | Admit: 2017-05-18 | Discharge: 2017-05-19 | Disposition: A | Discharge status: Home / Self Care | Payer: Self-pay | Attending: Emergency Medicine | Admitting: Emergency Medicine

## 2017-05-18 ENCOUNTER — Other Ambulatory Visit: Payer: Self-pay

## 2017-05-18 DIAGNOSIS — F17210 Nicotine dependence, cigarettes, uncomplicated: Secondary | ICD-10-CM | POA: Insufficient documentation

## 2017-05-18 DIAGNOSIS — J029 Acute pharyngitis, unspecified: Secondary | ICD-10-CM

## 2017-05-18 DIAGNOSIS — J069 Acute upper respiratory infection, unspecified: Secondary | ICD-10-CM | POA: Insufficient documentation

## 2017-05-18 LAB — RAPID STREP SCREEN (MED CTR MEBANE ONLY): STREPTOCOCCUS, GROUP A SCREEN (DIRECT): NEGATIVE

## 2017-05-18 MED ORDER — IBUPROFEN 100 MG/5ML PO SUSP: 400.0000 mg | Freq: Once | ORAL | Status: DC

## 2017-05-18 MED ORDER — AMOXICILLIN 250 MG/5ML PO SUSR: 500.0000 mg | Freq: Once | ORAL | Status: DC

## 2017-05-18 MED ORDER — AMOXICILLIN 400 MG/5ML PO SUSR: 400.0000 mg | Freq: Three times a day (TID) | ORAL | 0 refills | Status: AC

## 2017-05-18 MED ORDER — IBUPROFEN 100 MG/5ML PO SUSP: 400.0000 mg | Freq: Four times a day (QID) | ORAL | 1 refills | Status: DC | PRN

## 2017-05-18 NOTE — ED Triage Notes (Signed)
Pt c/o sore throat

## 2017-05-18 NOTE — Discharge Instructions (Signed)
Please wash hands frequently.  Use salt water gargles.  Use Chloraseptic spray.  Use Amoxil 3 times daily, and use ibuprofen every 6 hours for fever and/or aching.

## 2017-05-18 NOTE — ED Provider Notes (Signed)
[facility] Provider [facility]   CSN: [id number] Arrival date & time: 05/18/17  2203     History   Chief Complaint Chief Complaint  Patient presents with  . Sore Throat    HPI Timothy Barrera is a 32 y.o. male.  The history is provided by the patient.  Sore Throat  This is a new problem. The current episode started yesterday. The problem has been gradually worsening. Pertinent negatives include no chest pain, no abdominal pain, no headaches and no shortness of breath. The symptoms are aggravated by swallowing. Nothing relieves the symptoms. Treatments tried: salt water.    History reviewed. No pertinent past medical history.  There are no active problems to display for this patient.   Past Surgical History:  Procedure Laterality Date  . HERNIA REPAIR         Home Medications    Prior to Admission medications   Medication Sig Start Date End Date Taking? Authorizing Provider  furosemide (LASIX) 20 MG tablet Take 1 tablet (20 mg total) by mouth daily. 05/14/17   Rancour, Jeannett SeniorStephen, MD  pantoprazole (PROTONIX) 40 MG tablet Take 1 tablet (40 mg total) by mouth daily. 04/24/17   Dione BoozeGlick, David, MD    Family History No family history on file.  Social History Social History   Tobacco Use  . Smoking status: Current Some Day Smoker    Packs/day: 0.50    Types: Cigarettes  . Smokeless tobacco: Never Used  Substance Use Topics  . Alcohol use: No    Frequency: Never  . Drug use: No     Allergies   Patient has no known allergies.   Review of Systems Review of Systems  Constitutional: Negative for activity change.       All ROS Neg except as noted in HPI  HENT: Positive for congestion and sore throat. Negative for nosebleeds.   Eyes: Negative for photophobia and discharge.  Respiratory: Negative for cough, shortness of breath and wheezing.   Cardiovascular: Negative for chest pain and palpitations.  Gastrointestinal: Negative for abdominal pain  and blood in stool.  Genitourinary: Negative for dysuria, frequency and hematuria.  Musculoskeletal: Positive for myalgias. Negative for arthralgias, back pain and neck pain.  Skin: Negative.   Neurological: Negative for dizziness, seizures, speech difficulty and headaches.  Psychiatric/Behavioral: Negative for confusion and hallucinations.     Physical Exam Updated Vital Signs BP (!) 141/79 (BP Location: Right Arm)   Pulse 83   Temp 98.7 F (37.1 C) (Oral)   Resp 18   Ht 6\' 1"  (1.854 m)   Wt 106.6 kg (235 lb)   SpO2 93%   BMI 31.00 kg/m   Physical Exam  Constitutional: He is oriented to person, place, and time. He appears well-developed and well-nourished.  Non-toxic appearance.  HENT:  Head: Normocephalic.  Right Ear: Tympanic membrane and external ear normal.  Left Ear: Tympanic membrane and external ear normal.  Mouth/Throat: Uvula swelling present. Posterior oropharyngeal erythema present. No tonsillar abscesses.  Eyes: EOM and lids are normal. Pupils are equal, round, and reactive to light.  Neck: Normal range of motion. Neck supple. Carotid bruit is not present.  Few cervical lymph nodes appreciated.  Cardiovascular: Normal rate, regular rhythm, normal heart sounds, intact distal pulses and normal pulses.  Pulmonary/Chest: Breath sounds normal. No respiratory distress.  Abdominal: Soft. Bowel sounds are normal. There is no tenderness. There is no guarding.  Musculoskeletal: Normal range of motion.  Lymphadenopathy:  Head (right side): No submandibular adenopathy present.       Head (left side): No submandibular adenopathy present.    He has no cervical adenopathy.  Neurological: He is alert and oriented to person, place, and time. He has normal strength. No cranial nerve deficit or sensory deficit.  Skin: Skin is warm and dry.  Psychiatric: He has a normal mood and affect. His speech is normal.  Nursing note and vitals reviewed.    ED Treatments / Results    Labs (all labs ordered are listed, but only abnormal results are displayed) Labs Reviewed  RAPID STREP SCREEN (NOT AT [facility])  CULTURE, GROUP A STREP [facility])    EKG  EKG Interpretation None       Radiology No results found.  Procedures Procedures (including critical care time)  Medications Ordered in ED Medications  ibuprofen (ADVIL,MOTRIN) 100 MG/5ML suspension 400 mg (not administered)  amoxicillin (AMOXIL) 250 MG/5ML suspension 500 mg (not administered)     Initial Impression / Assessment and Plan / ED Course  I have reviewed the triage vital signs and the nursing notes.  Pertinent labs & imaging results that were available during my care of the patient were reviewed by me and considered in my medical decision making (see chart for details).       Final Clinical Impressions(s) / ED Diagnoses MDM  Vital signs within normal limits.  Patient has some increased redness of the posterior pharynx and swelling of the uvula.  He states that he has had some chills.  There also a few palpable cervical nodes noted.  Patient will be treated with Amoxil and ibuprofen.  Patient will continue salt water gargles and will try Chloraseptic.  Patient is to return to the emergency department or see his primary physician if not improving.   Final diagnoses:  Pharyngitis, unspecified etiology  Upper respiratory tract infection, unspecified type    ED Discharge Orders        Ordered    amoxicillin (AMOXIL) 400 MG/5ML suspension  3 times daily     05/18/17 2356    ibuprofen (CHILD IBUPROFEN) 100 MG/5ML suspension  Every 6 hours PRN     05/18/17 2356       Ivery Quale, PA-C 05/19/17 0158    Dione Booze, MD 05/19/17 [phone]

## 2017-05-21 LAB — CULTURE, GROUP A STREP (THRC)

## 2017-08-01 ENCOUNTER — Other Ambulatory Visit: Payer: Self-pay

## 2017-08-01 ENCOUNTER — Emergency Department (HOSPITAL_COMMUNITY)
Admission: EM | Admit: 2017-08-01 | Discharge: 2017-08-01 | Disposition: A | Discharge status: Home / Self Care | Payer: Self-pay | Attending: Emergency Medicine | Admitting: Emergency Medicine

## 2017-08-01 ENCOUNTER — Encounter (HOSPITAL_COMMUNITY): Payer: Self-pay

## 2017-08-01 DIAGNOSIS — F17210 Nicotine dependence, cigarettes, uncomplicated: Secondary | ICD-10-CM | POA: Insufficient documentation

## 2017-08-01 DIAGNOSIS — Z79899 Other long term (current) drug therapy: Secondary | ICD-10-CM | POA: Insufficient documentation

## 2017-08-01 DIAGNOSIS — K122 Cellulitis and abscess of mouth: Secondary | ICD-10-CM | POA: Insufficient documentation

## 2017-08-01 LAB — GROUP A STREP BY PCR: Group A Strep by PCR: NOT DETECTED

## 2017-08-01 MED ORDER — AMOXICILLIN 400 MG/5ML PO SUSR: 500.0000 mg | Freq: Two times a day (BID) | ORAL | 0 refills | Status: DC

## 2017-08-01 MED ORDER — ACETAMINOPHEN 500 MG PO TABS
1000.0000 mg | ORAL_TABLET | Freq: Once | ORAL | Status: DC
  Filled 2017-08-01: qty 2

## 2017-08-01 NOTE — ED Provider Notes (Signed)
[facility] EMERGENCY DEPARTMENT Provider Note   CSN: [id number] Arrival date & time: 08/01/17  0840     History   Chief Complaint Chief Complaint  Patient presents with  . Sore Throat    HPI Timothy Barrera is a 32 y.o. male.  Patient with no significant medical problems presents with sore throat since last night. No fevers or vomiting. Pain is swallowing. No significant change in voice.patient prefers liquid medications normally.     History reviewed. No pertinent past medical history.  There are no active problems to display for this patient.   Past Surgical History:  Procedure Laterality Date  . HERNIA REPAIR          Home Medications    Prior to Admission medications   Medication Sig Start Date End Date Taking? Authorizing Provider  amoxicillin (AMOXIL) 400 MG/5ML suspension Take 6.3 mLs (500 mg total) by mouth 2 (two) times daily. 08/01/17   Blane Ohara, MD  furosemide (LASIX) 20 MG tablet Take 1 tablet (20 mg total) by mouth daily. 05/14/17   Rancour, Jeannett Senior, MD  ibuprofen (CHILD IBUPROFEN) 100 MG/5ML suspension Take 20 mLs (400 mg total) by mouth every 6 (six) hours as needed. 05/18/17   Ivery Quale, PA-C  pantoprazole (PROTONIX) 40 MG tablet Take 1 tablet (40 mg total) by mouth daily. 04/24/17   Dione Booze, MD    Family History No family history on file.  Social History Social History   Tobacco Use  . Smoking status: Current Some Day Smoker    Packs/day: 0.50    Types: Cigarettes  . Smokeless tobacco: Never Used  Substance Use Topics  . Alcohol use: No    Frequency: Never  . Drug use: No     Allergies   Patient has no known allergies.   Review of Systems Review of Systems  Constitutional: Negative for chills and fever.  HENT: Positive for congestion and sore throat.   Respiratory: Negative for shortness of breath.   Gastrointestinal: Negative for abdominal pain and vomiting.  Musculoskeletal: Negative for back pain, neck pain  and neck stiffness.  Skin: Negative for rash.  Neurological: Negative for light-headedness and headaches.     Physical Exam Updated Vital Signs BP 132/73 (BP Location: Left Arm)   Pulse 91   Temp 99.4 F (37.4 C) (Oral)   Resp 18   Ht  (1.854 m)   Wt 102.1 kg (225 lb)   SpO2 96%   BMI 29.69 kg/m   Physical Exam  Constitutional: He is oriented to person, place, and time. He appears well-developed and well-nourished.  HENT:  Head: Normocephalic and atraumatic.  Mild swelling of uvula, no signs of abscess, no drooling, neck supple, no trismus.  Eyes: Conjunctivae are normal. Right eye exhibits no discharge. Left eye exhibits no discharge.  Neck: Normal range of motion. Neck supple. No tracheal deviation present.  Cardiovascular: Normal rate and regular rhythm.  Pulmonary/Chest: Effort normal and breath sounds normal.  Abdominal: Soft. He exhibits no distension. There is no tenderness. There is no guarding.  Musculoskeletal: He exhibits no edema.  Neurological: He is alert and oriented to person, place, and time.  Skin: Skin is warm. No rash noted.  Psychiatric: He has a normal mood and affect.  Nursing note and vitals reviewed.    ED Treatments / Results  Labs (all labs ordered are listed, but only abnormal results are displayed) Labs Reviewed  GROUP A STREP BY PCR    EKG None  Radiology  No results found.  Procedures Procedures (including critical care time)  Medications Ordered in ED Medications  acetaminophen (TYLENOL) tablet 1,000 mg (1,000 mg Oral Refused 08/01/17 4098)     Initial Impression / Assessment and Plan / ED Course  I have reviewed the triage vital signs and the nursing notes.  Pertinent labs & imaging results that were available during my care of the patient were reviewed by me and considered in my medical decision making (see chart for details).    Patient presents with mild pharyngitis/uvulitis. Discussed follow-up culture,  amoxicillin and follow-up [facility]. Patient does not want pain medicines at this time.  Results and differential diagnosis were discussed with the patient/parent/guardian. Xrays were independently reviewed by myself.  Close follow up outpatient was discussed, comfortable with the plan.   Medications  acetaminophen (TYLENOL) tablet 1,000 mg (1,000 mg Oral Refused 08/01/17 0938)    Vitals:   08/01/17 0851  BP: 132/73  Pulse: 91  Resp: 18  Temp: 99.4 F (37.4 C)  TempSrc: Oral  SpO2: 96%  Weight: 102.1 kg (225 lb)  Height:  (1.854 m)    Final diagnoses:  Uvulitis     Final Clinical Impressions(s) / ED Diagnoses   Final diagnoses:  Uvulitis    ED Discharge Orders        Ordered    amoxicillin (AMOXIL) 400 MG/5ML suspension  2 times daily     08/01/17 1037       Blane Ohara, MD 08/01/17 1044

## 2017-08-01 NOTE — Discharge Instructions (Signed)
Use tylenol and motrin as needed every 6 hours for pain or fever. Take antibiotics as directed and follow-up culture results and for reassessment in 2-3 days. Come back to the ER if you have difficulty swallowing, difficulty breathing or worsening symptoms.

## 2017-08-01 NOTE — ED Triage Notes (Signed)
Pt c/o sore throat since yesterday 

## 2017-09-11 ENCOUNTER — Emergency Department (HOSPITAL_COMMUNITY)
Admission: EM | Admit: 2017-09-11 | Discharge: 2017-09-11 | Disposition: A | Discharge status: Home / Self Care | Payer: Self-pay | Attending: Emergency Medicine | Admitting: Emergency Medicine

## 2017-09-11 ENCOUNTER — Other Ambulatory Visit: Payer: Self-pay

## 2017-09-11 ENCOUNTER — Encounter (HOSPITAL_COMMUNITY): Payer: Self-pay | Admitting: *Deleted

## 2017-09-11 DIAGNOSIS — Z23 Encounter for immunization: Secondary | ICD-10-CM | POA: Insufficient documentation

## 2017-09-11 DIAGNOSIS — L0291 Cutaneous abscess, unspecified: Secondary | ICD-10-CM

## 2017-09-11 DIAGNOSIS — L02416 Cutaneous abscess of left lower limb: Secondary | ICD-10-CM | POA: Insufficient documentation

## 2017-09-11 DIAGNOSIS — Z79899 Other long term (current) drug therapy: Secondary | ICD-10-CM | POA: Insufficient documentation

## 2017-09-11 DIAGNOSIS — F17210 Nicotine dependence, cigarettes, uncomplicated: Secondary | ICD-10-CM | POA: Insufficient documentation

## 2017-09-11 MED ORDER — DOXYCYCLINE HYCLATE 100 MG PO CAPS: 100.0000 mg | ORAL_CAPSULE | Freq: Two times a day (BID) | ORAL | 0 refills | Status: DC

## 2017-09-11 MED ORDER — TETANUS-DIPHTH-ACELL PERTUSSIS 5-2.5-18.5 LF-MCG/0.5 IM SUSP
0.5000 mL | Freq: Once | INTRAMUSCULAR | Status: AC
  Administered 2017-09-11: 0.5 mL via INTRAMUSCULAR
  Filled 2017-09-11: qty 0.5

## 2017-09-11 MED ORDER — POVIDONE-IODINE 10 % OINT PACKET
TOPICAL_OINTMENT | CUTANEOUS | Status: AC
  Filled 2017-09-11: qty 2

## 2017-09-11 MED ORDER — DOXYCYCLINE HYCLATE 100 MG PO TABS
100.0000 mg | ORAL_TABLET | Freq: Once | ORAL | Status: AC
  Administered 2017-09-11: 100 mg via ORAL
  Filled 2017-09-11: qty 1

## 2017-09-11 MED ORDER — LIDOCAINE-EPINEPHRINE (PF) 2 %-1:200000 IJ SOLN
20.0000 mL | Freq: Once | INTRAMUSCULAR | Status: AC
  Administered 2017-09-11: 20 mL
  Filled 2017-09-11: qty 20

## 2017-09-11 NOTE — ED Triage Notes (Signed)
Pt states he was bit by some type of bug x 1 week ago to the left leg; pt has redness with dark area in the middle of the red area; pt states he has pain with walking;

## 2017-09-11 NOTE — ED Notes (Signed)
Dressing applied and wound care discussed, pt and family verbalized understanding

## 2017-09-11 NOTE — Discharge Instructions (Signed)
Take antibiotics as prescribed.  perform warm soaks twice daily as recommended.  Return to the ED in 2 days for wound check.  Return to the ED sooner if you develop worsening pain, redness, fever or any other concerns.

## 2017-09-11 NOTE — ED Provider Notes (Signed)
[facility] EMERGENCY DEPARTMENT Provider Note   CSN: [id number] Arrival date & time: 09/11/17  0127     History   Chief Complaint Chief Complaint  Patient presents with  . Insect Bite    HPI Timothy Barrera is a 32 y.o. male.  Patient presents with 1 week of redness and pain to his left lower leg.  Believes he was bitten with something after working in the garden a week ago.  Has had increasing redness and pain since.  No bleeding or drainage.  No fever.  Denies any focal weakness, numbness or tingling.  No chest pain or shortness of breath.   The history is provided by the patient.    History reviewed. No pertinent past medical history.  There are no active problems to display for this patient.   Past Surgical History:  Procedure Laterality Date  . HERNIA REPAIR          Home Medications    Prior to Admission medications   Medication Sig Start Date End Date Taking? Authorizing Provider  amoxicillin (AMOXIL) 400 MG/5ML suspension Take 6.3 mLs (500 mg total) by mouth 2 (two) times daily. 08/01/17   Blane Ohara, MD  furosemide (LASIX) 20 MG tablet Take 1 tablet (20 mg total) by mouth daily. 05/14/17   Oreatha Fabry, Jeannett Senior, MD  ibuprofen (CHILD IBUPROFEN) 100 MG/5ML suspension Take 20 mLs (400 mg total) by mouth every 6 (six) hours as needed. 05/18/17   Ivery Quale, PA-C  pantoprazole (PROTONIX) 40 MG tablet Take 1 tablet (40 mg total) by mouth daily. 04/24/17   Dione Booze, MD    Family History History reviewed. No pertinent family history.  Social History Social History   Tobacco Use  . Smoking status: Current Some Day Smoker    Packs/day: 0.50    Types: Cigarettes  . Smokeless tobacco: Never Used  Substance Use Topics  . Alcohol use: No    Frequency: Never  . Drug use: No     Allergies   Patient has no known allergies.   Review of Systems Review of Systems  Constitutional: Negative for activity change, appetite change and fever.  HENT: Positive  for congestion. Negative for mouth sores and nosebleeds.   Respiratory: Negative for cough, chest tightness and shortness of breath.   Cardiovascular: Negative for chest pain and palpitations.  Gastrointestinal: Negative for abdominal pain, nausea and vomiting.  Genitourinary: Negative for dysuria, hematuria, testicular pain and urgency.  Musculoskeletal: Negative for arthralgias and myalgias.  Skin: Positive for rash and wound.  Neurological: Negative for dizziness.    all other systems are negative except as noted in the HPI and PMH.    Physical Exam Updated Vital Signs BP (!) 139/96 (BP Location: Left Arm)   Pulse (!) 101   Temp 98.1 F (36.7 C) (Oral)   Resp 18   Ht 6\' 1"  (1.854 m)   Wt 99.8 kg (220 lb)   SpO2 92%   BMI 29.03 kg/m   Physical Exam  Constitutional: He is oriented to person, place, and time. He appears well-developed and well-nourished. No distress.  HENT:  Head: Normocephalic and atraumatic.  Mouth/Throat: Oropharynx is clear and moist. No oropharyngeal exudate.  Eyes: Pupils are equal, round, and reactive to light. Conjunctivae and EOM are normal.  Neck: Normal range of motion. Neck supple.  No meningismus.  Cardiovascular: Normal rate, regular rhythm, normal heart sounds and intact distal pulses.  No murmur heard. Pulmonary/Chest: Effort normal and breath sounds normal. No respiratory distress.  Abdominal: Soft. There is no tenderness. There is no rebound and no guarding.  Musculoskeletal: Normal range of motion. He exhibits tenderness. He exhibits no edema.  3 cm area of induration and erythema to  left lower leg.  There is no fluctuance.  Compartments are soft.  Intact DP and PT pulses.  Neurological: He is alert and oriented to person, place, and time. No cranial nerve deficit. He exhibits normal muscle tone. Coordination normal.   5/5 strength throughout. CN 2-12 intact.Equal grip strength.   Skin: Skin is warm.  Psychiatric: He has a normal mood  and affect. His behavior is normal.  Nursing note and vitals reviewed.    ED Treatments / Results  Labs (all labs ordered are listed, but only abnormal results are displayed) Labs Reviewed - No data to display  EKG None  Radiology No results found.  Procedures .Marland Kitchen.Incision and Drainage Date/Time: 09/11/2017 2:53 AM Performed by: Glynn Octaveancour, Dulse Rutan, MD Authorized by: Glynn Octaveancour, Breshay Ilg, MD   Consent:    Consent obtained:  Verbal   Consent given by:  Patient   Risks discussed:  Bleeding, incomplete drainage, pain and infection Location:    Type:  Abscess   Size:  2   Location:  Lower extremity   Lower extremity location:  Leg   Leg location:  L lower leg Pre-procedure details:    Skin preparation:  Betadine Anesthesia (see MAR for exact dosages):    Anesthesia method:  Local infiltration   Local anesthetic:  Lidocaine 2% WITH epi Procedure type:    Complexity:  Simple Procedure details:    Needle aspiration: no     Incision types:  Single straight   Incision depth:  Subcutaneous   Scalpel blade:  11   Wound management:  Probed and deloculated, irrigated with saline and extensive cleaning   Drainage:  Purulent   Drainage amount:  Moderate   Wound treatment:  Wound left open   Packing materials:  None Post-procedure details:    Patient tolerance of procedure:  Tolerated well, no immediate complications   (including critical care time)  Medications Ordered in ED Medications  lidocaine-EPINEPHrine (XYLOCAINE W/EPI) 2 %-1:200000 (PF) injection 20 mL (has no administration in time range)  Tdap (BOOSTRIX) injection 0.5 mL (has no administration in time range)     Initial Impression / Assessment and Plan / ED Course  I have reviewed the triage vital signs and the nursing notes.  Pertinent labs & imaging results that were available during my care of the patient were reviewed by me and considered in my medical decision making (see chart for details).    Patient with  cellulitis and abscess to the left lower leg.  Hemodynamically stable and afebrile.  Tetanus updated.  Incision and drainage performed as above.  Patient given wound care instructions, warm soaks twice daily, wound check in 2 days.  Antibiotic prescription given given surrounding cellulitis.  Return to ED sooner if redness spreads beyond current borders, fever, vomiting or other concerns. Final Clinical Impressions(s) / ED Diagnoses   Final diagnoses:  Abscess    ED Discharge Orders    None       Kerry Chisolm, Jeannett SeniorStephen, MD 09/11/17 [phone]

## 2017-09-12 ENCOUNTER — Emergency Department (HOSPITAL_COMMUNITY)
Admission: EM | Admit: 2017-09-12 | Discharge: 2017-09-12 | Disposition: A | Discharge status: Home / Self Care | Payer: Self-pay | Attending: Emergency Medicine | Admitting: Emergency Medicine

## 2017-09-12 ENCOUNTER — Encounter (HOSPITAL_COMMUNITY): Payer: Self-pay | Admitting: Emergency Medicine

## 2017-09-12 DIAGNOSIS — Z5321 Procedure and treatment not carried out due to patient leaving prior to being seen by health care provider: Secondary | ICD-10-CM | POA: Insufficient documentation

## 2017-09-12 DIAGNOSIS — L03116 Cellulitis of left lower limb: Secondary | ICD-10-CM

## 2017-09-12 DIAGNOSIS — L02416 Cutaneous abscess of left lower limb: Secondary | ICD-10-CM | POA: Insufficient documentation

## 2017-09-12 LAB — CBC WITH DIFFERENTIAL/PLATELET
Basophils Absolute: 0 10*3/uL (ref 0.0–0.1)
Basophils Relative: 0 %
EOS ABS: 0.1 10*3/uL (ref 0.0–0.7)
EOS PCT: 2 %
HCT: 51.1 % (ref 39.0–52.0)
Hemoglobin: 16.8 g/dL (ref 13.0–17.0)
LYMPHS ABS: 0.8 10*3/uL (ref 0.7–4.0)
LYMPHS PCT: 14 %
MCH: 29.5 pg (ref 26.0–34.0)
MCHC: 32.9 g/dL (ref 30.0–36.0)
MCV: 89.8 fL (ref 78.0–100.0)
Monocytes Absolute: 0.3 10*3/uL (ref 0.1–1.0)
Monocytes Relative: 6 %
Neutro Abs: 4.4 10*3/uL (ref 1.7–7.7)
Neutrophils Relative %: 78 %
PLATELETS: 139 10*3/uL — AB (ref 150–400)
RBC: 5.69 MIL/uL (ref 4.22–5.81)
RDW: 13.3 % (ref 11.5–15.5)
WBC: 5.6 10*3/uL (ref 4.0–10.5)

## 2017-09-12 LAB — BASIC METABOLIC PANEL
Anion gap: 9 (ref 5–15)
BUN: 8 mg/dL (ref 6–20)
CO2: 31 mmol/L (ref 22–32)
Calcium: 9.6 mg/dL (ref 8.9–10.3)
Chloride: 103 mmol/L (ref 101–111)
Creatinine, Ser: 0.56 mg/dL — ABNORMAL LOW (ref 0.61–1.24)
GFR calc Af Amer: >60 mL/min (ref >60)
GLUCOSE: 110 mg/dL — AB (ref 65–99)
POTASSIUM: 4.3 mmol/L (ref 3.5–5.1)
SODIUM: 143 mmol/L (ref 135–145)

## 2017-09-12 MED ORDER — OXYCODONE-ACETAMINOPHEN 5-325 MG PO TABS
1.0000 | ORAL_TABLET | Freq: Once | ORAL | Status: DC
  Filled 2017-09-12: qty 1

## 2017-09-12 MED ORDER — VANCOMYCIN HCL IN DEXTROSE 1-5 GM/200ML-% IV SOLN
1000.0000 mg | Freq: Once | INTRAVENOUS | Status: DC
  Filled 2017-09-12: qty 200

## 2017-09-12 NOTE — ED Provider Notes (Signed)
[facility] EMERGENCY DEPARTMENT Provider Note   CSN: [id number] Arrival date & time: 09/12/17  1827     History   Chief Complaint Chief Complaint  Patient presents with  . Abscess    HPI Timothy Barrera is a 32 y.o. male.  HPI  Pt was seen at 2035.  Per pt, c/o gradual onset and persistence of constant "red area" to his left lower leg that began 1 week ago after working outside in his garden. Pt was evaluated in the ED yesterday for this complaint, tx I&D of left leg abscess, rx doxycycline for surrounding cellulitis, and instructed to f/u in the ED in 2 days for recheck. Pt states the "redness got bigger" and the area "still hurts" so he came back to the ED today. Pt states he has taken 2 doses of his antibiotic and been washing the wound with soap/water. Denies new injury, no fevers, no other areas of rash, no streaking, no focal motor weakness, no tingling/numbness in extremities.    History reviewed. No pertinent past medical history.  There are no active problems to display for this patient.   Past Surgical History:  Procedure Laterality Date  . HERNIA REPAIR          Home Medications    Prior to Admission medications   Medication Sig Start Date End Date Taking? Authorizing Provider  amoxicillin (AMOXIL) 400 MG/5ML suspension Take 6.3 mLs (500 mg total) by mouth 2 (two) times daily. 08/01/17   Blane OharaZavitz, Joshua, MD  doxycycline (VIBRAMYCIN) 100 MG capsule Take 1 capsule (100 mg total) by mouth 2 (two) times daily. 09/11/17   Rancour, Jeannett SeniorStephen, MD  furosemide (LASIX) 20 MG tablet Take 1 tablet (20 mg total) by mouth daily. 05/14/17   Rancour, Jeannett SeniorStephen, MD  ibuprofen (CHILD IBUPROFEN) 100 MG/5ML suspension Take 20 mLs (400 mg total) by mouth every 6 (six) hours as needed. 05/18/17   Ivery QualeBryant, Hobson, PA-C  pantoprazole (PROTONIX) 40 MG tablet Take 1 tablet (40 mg total) by mouth daily. 04/24/17   Dione BoozeGlick, David, MD    Family History History reviewed. No pertinent family  history.  Social History Social History   Tobacco Use  . Smoking status: Current Some Day Smoker    Packs/day: 0.50    Types: Cigarettes  . Smokeless tobacco: Never Used  Substance Use Topics  . Alcohol use: Yes    Frequency: Never    Comment: socially  . Drug use: No     Allergies   Patient has no known allergies.   Review of Systems Review of Systems ROS: Statement: All systems negative except as marked or noted in the HPI; Constitutional: Negative for fever and chills. ; ; Eyes: Negative for eye pain, redness and discharge. ; ; ENMT: Negative for ear pain, hoarseness, nasal congestion, sinus pressure and sore throat. ; ; Cardiovascular: Negative for chest pain, palpitations, diaphoresis, dyspnea and peripheral edema. ; ; Respiratory: Negative for cough, wheezing and stridor. ; ; Gastrointestinal: Negative for nausea, vomiting, diarrhea, abdominal pain, blood in stool, hematemesis, jaundice and rectal bleeding. . ; ; Genitourinary: Negative for dysuria, flank pain and hematuria. ; ; Musculoskeletal: Negative for back pain and neck pain. Negative for swelling and trauma.; ; Skin: +rash. Negative for pruritus, abrasions, blisters, bruising and skin lesion.; ; Neuro: Negative for headache, lightheadedness and neck stiffness. Negative for weakness, altered level of consciousness, altered mental status, extremity weakness, paresthesias, involuntary movement, seizure and syncope.       Physical Exam Updated Vital Signs  BP (!) 105/101 (BP Location: Right Arm)   Pulse 91   Temp 97.9 F (36.6 C) (Oral)   Resp 16   Ht 6\' 1"  (1.854 m)   Wt 99.8 kg (220 lb)   SpO2 94%   BMI 29.03 kg/m   Physical Exam 2040: Physical examination:  Nursing notes reviewed; Vital signs and O2 SAT reviewed;  Constitutional: Well developed, Well nourished, Well hydrated, In no acute distress; Head:  Normocephalic, atraumatic; Eyes: EOMI, PERRL, No scleral icterus; ENMT: Mouth and pharynx normal, Mucous  membranes moist; Neck: Supple, Full range of motion, No lymphadenopathy; Cardiovascular: Regular rate and rhythm, No gallop; Respiratory: Breath sounds clear & equal bilaterally, No wheezes.  Speaking full sentences with ease, Normal respiratory effort/excursion; Chest: Nontender, Movement normal; Abdomen: Soft, Nontender, Nondistended, Normal bowel sounds; Genitourinary: No CVA tenderness; Extremities: Peripheral pulses normal, No deformity. No edema, No calf edema or asymmetry. +approximately 4cm diameter area of erythema with central shallow open wound to left lower medial leg, no streaking, no drainage, no fluctuance, no soft tissue crepitus.; Neuro: AA&Ox3, Major CN grossly intact.  Speech clear. No gross focal motor or sensory deficits in extremities. Climbs on and off stretcher easily by himself. Gait steady..; Skin: Color normal, Warm, Dry.   ED Treatments / Results  Labs (all labs ordered are listed, but only abnormal results are displayed)   EKG None  Radiology   Procedures Procedures (including critical care time)  Medications Ordered in ED Medications  vancomycin (VANCOCIN) IVPB 1000 mg/200 mL premix (1,000 mg Intravenous New Bag/Given 09/12/17 2056)  oxyCODONE-acetaminophen (PERCOCET/ROXICET) 5-325 MG per tablet 1 tablet (has no administration in time range)     Initial Impression / Assessment and Plan / ED Course  I have reviewed the triage vital signs and the nursing notes.  Pertinent labs & imaging results that were available during my care of the patient were reviewed by me and considered in my medical decision making (see chart for details).  MDM Reviewed: previous chart, nursing note and vitals Reviewed previous: labs Interpretation: labs   Results for orders placed or performed during the hospital encounter of 09/12/17  CBC with Differential  Result Value Ref Range   WBC 5.6 4.0 - 10.5 K/uL   RBC 5.69 4.22 - 5.81 MIL/uL   Hemoglobin 16.8 13.0 - 17.0 g/dL    HCT 16.1 09.6 - 04.5 %   MCV 89.8 78.0 - 100.0 fL   MCH 29.5 26.0 - 34.0 pg   MCHC 32.9 30.0 - 36.0 g/dL   RDW 40.9 81.1 - 91.4 %   Platelets 139 (L) 150 - 400 K/uL   Neutrophils Relative % 78 %   Neutro Abs 4.4 1.7 - 7.7 K/uL   Lymphocytes Relative 14 %   Lymphs Abs 0.8 0.7 - 4.0 K/uL   Monocytes Relative 6 %   Monocytes Absolute 0.3 0.1 - 1.0 K/uL   Eosinophils Relative 2 %   Eosinophils Absolute 0.1 0.0 - 0.7 K/uL   Basophils Relative 0 %   Basophils Absolute 0.0 0.0 - 0.1 K/uL  Basic metabolic panel  Result Value Ref Range   Sodium 143 135 - 145 mmol/L   Potassium 4.3 3.5 - 5.1 mmol/L   Chloride 103 101 - 111 mmol/L   CO2 31 22 - 32 mmol/L   Glucose, Bld 110 (H) 65 - 99 mg/dL   BUN 8 6 - 20 mg/dL   Creatinine, Ser 7.82 (L) 0.61 - 1.24 mg/dL   Calcium 9.6 8.9 -  10.3 mg/dL   GFR calc non Af Amer >60 >60 mL/min   GFR calc Af Amer >60 >60 mL/min   Anion gap 9 5 - 15    2050:  Pt has taken 2 doses of his PO abx at home.  He remains afebrile with normal WBC count. No clear indication for admission at this time. Will dose IV abx in the ED tonight and have pt return to ED in the next 24/48 hours for re-check.  2100:  Pt told the ED RN that he was not going to stay for IV abx and left the ED.     Final Clinical Impressions(s) / ED Diagnoses   Final diagnoses:  None    ED Discharge Orders    None       Samuel Jester, DO 09/17/17 1628

## 2017-09-12 NOTE — ED Triage Notes (Signed)
Patient states he was here yesterday for an abscess to left lower leg. States he was told to come back Tuesday for wound check but he returns today for worsening pain and states the wound is bigger.

## 2018-10-23 ENCOUNTER — Other Ambulatory Visit: Payer: Self-pay

## 2018-10-23 ENCOUNTER — Emergency Department (HOSPITAL_COMMUNITY)
Admission: EM | Admit: 2018-10-23 | Discharge: 2018-10-23 | Disposition: A | Discharge status: Home / Self Care | Payer: Self-pay | Attending: Emergency Medicine | Admitting: Emergency Medicine

## 2018-10-23 ENCOUNTER — Encounter (HOSPITAL_COMMUNITY): Payer: Self-pay | Admitting: *Deleted

## 2018-10-23 DIAGNOSIS — R0602 Shortness of breath: Secondary | ICD-10-CM | POA: Insufficient documentation

## 2018-10-23 DIAGNOSIS — Z5321 Procedure and treatment not carried out due to patient leaving prior to being seen by health care provider: Secondary | ICD-10-CM | POA: Insufficient documentation

## 2018-10-23 HISTORY — DX: Unspecified speech disturbances: R47.9

## 2018-10-23 NOTE — ED Notes (Signed)
Had placed pt on monitor on arrival. Pt stated he needed to use the bathroom. Unhooked from monitor and pt stated "I can't go like that and I don't want all of this on." removed all stickers from himself and walked to bathroom.

## 2018-10-23 NOTE — ED Notes (Signed)
Pt walked out of bathroom and stated he wanted to go home. Advised he should stay based on his throat swelling and pt adamantly refused. Began cursing at me and demanding to leave. Pt signed ama and walked down hallway with steady gait.

## 2018-10-23 NOTE — ED Triage Notes (Signed)
Pt brought in by [facility] with c/o SOB. O2 sat 93% on RA. Pt reports this feeling started after he was eating about 1400 today and then felt like something was stuck in his throat. Pt reports the SOB is intermittent. Pt also c/o mid abdominal pain. Pt denies difficulty breathing and is able to maintain secretions.

## 2019-01-04 ENCOUNTER — Telehealth: Payer: Self-pay

## 2019-01-04 NOTE — Telephone Encounter (Signed)
Pt. Eligibility is 01/04/2019 till 01/04/2020 with [facility]. Faxed over [place] Application and other docs.  Drema Halon

## 2019-01-09 ENCOUNTER — Encounter: Payer: Self-pay | Admitting: Physician Assistant

## 2019-01-09 ENCOUNTER — Ambulatory Visit: Payer: Self-pay | Admitting: Physician Assistant

## 2019-01-09 ENCOUNTER — Other Ambulatory Visit: Payer: Self-pay

## 2019-01-09 DIAGNOSIS — R0981 Nasal congestion: Secondary | ICD-10-CM

## 2019-01-09 DIAGNOSIS — Z20822 Contact with and (suspected) exposure to covid-19: Secondary | ICD-10-CM

## 2019-01-09 DIAGNOSIS — Z7689 Persons encountering health services in other specified circumstances: Secondary | ICD-10-CM

## 2019-01-09 DIAGNOSIS — F17200 Nicotine dependence, unspecified, uncomplicated: Secondary | ICD-10-CM

## 2019-01-09 NOTE — Progress Notes (Signed)
   There were no vitals taken for this visit.   Subjective:    Patient ID: Timothy Barrera, male    DOB: May 20, 1985, 33 y.o.   MRN: [id number]  HPI: Timothy Barrera is a 33 y.o. male presenting on 01/09/2019 for No chief complaint on file.   HPI   Pt here to establish care as a new pt.  He stated nasal congestion on covid screening so appointment was via [employer] due to pandemic  I connected with  [name] on 01/09/19 by a video enabled telemedicine application and verified that I am speaking with the correct person using two identifiers.   I discussed the limitations of evaluation and management by telemedicine. The patient expressed understanding and agreed to proceed.  Pt is in his car.  Provider is in office.     Pt moved here from [place] 4 or 5 yeears ago.   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  This has been going on for about a year.  No injury.   Rash also.  On both foot.  For 2 year.  On top and side of foot.    Pt c/o runny nose.  He says he thinks it's due to allergies.    Pt denies history bipolar, depression, anxiety.    Relevant past medical, surgical, family and social history reviewed and updated as indicated. Interim medical history since our last visit reviewed. Allergies and medications reviewed and updated.  No current outpatient medications on file.    Review of Systems  Per HPI unless specifically indicated above     Objective:    There were no vitals taken for this visit.  Wt Readings from Last 3 Encounters:  10/23/18 240 lb (108.9 kg)  09/12/17 220 lb (99.8 kg)  09/11/17 220 lb (99.8 kg)    Physical Exam Constitutional:      General: He is not in acute distress.    Appearance: He is obese.  HENT:     Head: Normocephalic and atraumatic.     Mouth/Throat:     Comments: + speech impediment  Neurological:     Mental Status: He is alert and oriented to person, place, and time.   Psychiatric:        Mood and Affect: Affect is angry.     Comments: Pt very angry and hostile and very argumentative when discussing office policies on covid prevention.              Assessment & Plan:    Encounter Diagnoses  Name Primary?  . Encounter to establish care Yes  . Nasal congestion   . Suspected COVID-19 virus infection   . Tobacco use disorder        -pt is sent for covid testing.  He is counseled on self-isolation until result -will get baseline labs , xrays knees, examine rash and schedule follow up when results available.  -pt to contact office for worsening or new symptoms

## 2019-01-09 NOTE — Patient Instructions (Signed)
[facility] [address], [place], [place] [address]  Covid testing- [place] entrance

## 2019-01-11 LAB — NOVEL CORONAVIRUS, NAA: SARS-CoV-2, NAA: NOT DETECTED

## 2019-01-22 ENCOUNTER — Other Ambulatory Visit: Payer: Self-pay

## 2019-01-22 ENCOUNTER — Ambulatory Visit: Payer: Self-pay | Admitting: Physician Assistant

## 2019-01-22 ENCOUNTER — Encounter: Payer: Self-pay | Admitting: Physician Assistant

## 2019-01-22 VITALS — BP 116/84 | HR 86 | Temp 97.5°F

## 2019-01-22 DIAGNOSIS — R0989 Other specified symptoms and signs involving the circulatory and respiratory systems: Secondary | ICD-10-CM

## 2019-01-22 DIAGNOSIS — R531 Weakness: Secondary | ICD-10-CM

## 2019-01-22 DIAGNOSIS — Z13220 Encounter for screening for lipoid disorders: Secondary | ICD-10-CM

## 2019-01-22 DIAGNOSIS — F17200 Nicotine dependence, unspecified, uncomplicated: Secondary | ICD-10-CM

## 2019-01-22 DIAGNOSIS — I872 Venous insufficiency (chronic) (peripheral): Secondary | ICD-10-CM

## 2019-01-22 DIAGNOSIS — Z131 Encounter for screening for diabetes mellitus: Secondary | ICD-10-CM

## 2019-01-22 DIAGNOSIS — R479 Unspecified speech disturbances: Secondary | ICD-10-CM

## 2019-01-22 NOTE — Progress Notes (Signed)
BP 116/84   Pulse 86   Temp (!) 97.5 F (36.4 C)   SpO2 92%    Subjective:    Patient ID: Timothy Barrera, male    DOB: [birth year], 33 y.o.   MRN: [id number]  HPI: Timothy Barrera is a 33 y.o. male presenting on 01/22/2019 for Knee Problem (bilateral knees for about a year. pt states his knees give out every once in a while he is standing or walking and he "just falls". pt states he has a family history of muscle distrophy and is concerned he may have it. pt states knees are painful at times) and Rash (bilateral feet up to his ankles. pt states he has had it for a about 3-4 years. pt states not itchy or burning)   HPI    Patient had a negative COVID-19 screening questionnaire  Chief Complaint  Patient presents with  . Knee Problem    bilateral knees for about a year. pt states his knees give out every once in a while he is standing or walking and he "just falls". pt states he has a family history of muscle distrophy and is concerned he may have it. pt states knees are painful at times  . Rash    bilateral feet up to his ankles. pt states he has had it for a about 3-4 years. pt states not itchy or burning      Patient says he has been having weakness with walking and falling ever for "a long time".  He is unable to state more definitive time frame for this.  He does say that it has been on the many years scale.    Relevant past medical, surgical, family and social history reviewed and updated as indicated. Interim medical history since our last visit reviewed. Allergies and medications reviewed and updated.    No current outpatient medications on file.     Review of Systems  Per HPI unless specifically indicated above     Objective:    BP 116/84   Pulse 86   Temp (!) 97.5 F (36.4 C)   SpO2 92%   Wt Readings from Last 3 Encounters:  10/23/18 240 lb (108.9 kg)  09/12/17 220 lb (99.8 kg)  09/11/17 220 lb (99.8 kg)    Physical Exam Vitals signs reviewed.   Constitutional:      General: He is not in acute distress.    Appearance: He is well-developed. He is not ill-appearing.  HENT:     Head: Normocephalic and atraumatic.  Eyes:     Conjunctiva/sclera: Conjunctivae normal.     Pupils: Pupils are equal, round, and reactive to light.  Neck:     Musculoskeletal: Neck supple.     Thyroid: No thyromegaly.  Cardiovascular:     Rate and Rhythm: Normal rate and regular rhythm.     Pulses:          Femoral pulses are 2+ on the right side and 2+ on the left side.      Dorsalis pedis pulses are 1+ on the right side and 1+ on the left side.  Pulmonary:     Effort: Pulmonary effort is normal.     Breath sounds: Normal breath sounds. No wheezing or rales.  Abdominal:     General: Bowel sounds are normal.     Palpations: Abdomen is soft. There is no mass.     Tenderness: There is no abdominal tenderness.  Musculoskeletal:     Right knee: He exhibits  normal range of motion, no swelling and no deformity. No tenderness found.     Left knee: He exhibits normal range of motion, no swelling and no deformity. No tenderness found.     Right lower leg: No edema.     Left lower leg: No edema.  Lymphadenopathy:     Cervical: No cervical adenopathy.  Skin:    General: Skin is warm and dry.     Comments: Brown rash bilateral feet and ankles  Neurological:     Mental Status: He is alert and oriented to person, place, and time.     Cranial Nerves: No facial asymmetry.     Motor: Weakness present. No tremor.     Gait: Gait abnormal.     Deep Tendon Reflexes:     Reflex Scores:      Patellar reflexes are 2+ on the right side and 2+ on the left side.    Comments: Pt with speech impediment.  Pt with diffuse muscle weakness.  Gait impaired due to weakness.    Psychiatric:        Behavior: Behavior normal.        Thought Content: Thought content normal.          Assessment & Plan:    Encounter Diagnoses  Name Primary?  . Diminished pulses in lower  extremity Yes  . Stasis dermatitis of both legs   . Weakness   . Tobacco use disorder   . Speech impediment   . Screening cholesterol level   . Screening for diabetes mellitus      -Patient is scheduled for influenza vaccination -Will refer patient for ABI testing of lower extremities. -Patient is counseled on nature of his "rash" of his feet and ankles.  Discussed that it is related to venous stasis changes -encouraged smoking cessation -Patient is encouraged to wear a mask when in public to reduce risk of Covid 19 per CDC guidelines. -will get baseline labs -pt will be referred to neurology for evaluation of weakness -pt is given application for [employer] financial assistance -pt to follow up in 1 month.  He is to contact office sooner for worsening or new symptoms

## 2019-01-24 ENCOUNTER — Ambulatory Visit (HOSPITAL_COMMUNITY)
Admission: RE | Admit: 2019-01-24 | Discharge: 2019-01-24 | Disposition: A | Discharge status: Home / Self Care | Payer: Self-pay | Source: Ambulatory Visit | Attending: Physician Assistant | Admitting: Physician Assistant

## 2019-01-24 ENCOUNTER — Other Ambulatory Visit: Payer: Self-pay

## 2019-01-24 ENCOUNTER — Other Ambulatory Visit (HOSPITAL_COMMUNITY)
Admission: RE | Admit: 2019-01-24 | Discharge: 2019-01-24 | Disposition: A | Discharge status: Home / Self Care | Payer: Self-pay | Source: Ambulatory Visit | Attending: Physician Assistant | Admitting: Physician Assistant

## 2019-01-24 DIAGNOSIS — R69 Illness, unspecified: Secondary | ICD-10-CM | POA: Insufficient documentation

## 2019-01-24 DIAGNOSIS — R0989 Other specified symptoms and signs involving the circulatory and respiratory systems: Secondary | ICD-10-CM

## 2019-02-12 ENCOUNTER — Other Ambulatory Visit (HOSPITAL_COMMUNITY)
Admission: RE | Admit: 2019-02-12 | Discharge: 2019-02-12 | Disposition: A | Discharge status: Home / Self Care | Payer: Self-pay | Source: Ambulatory Visit | Attending: Physician Assistant | Admitting: Physician Assistant

## 2019-02-12 DIAGNOSIS — R531 Weakness: Secondary | ICD-10-CM | POA: Insufficient documentation

## 2019-02-12 DIAGNOSIS — Z13220 Encounter for screening for lipoid disorders: Secondary | ICD-10-CM | POA: Insufficient documentation

## 2019-02-12 DIAGNOSIS — Z131 Encounter for screening for diabetes mellitus: Secondary | ICD-10-CM | POA: Insufficient documentation

## 2019-02-12 LAB — COMPREHENSIVE METABOLIC PANEL
ALT: 47 U/L — ABNORMAL HIGH (ref 0–44)
AST: 37 U/L (ref 15–41)
Albumin: 3.7 g/dL (ref 3.5–5.0)
Alkaline Phosphatase: 48 U/L (ref 38–126)
Anion gap: 8 (ref 5–15)
BUN: 8 mg/dL (ref 6–20)
CO2: 30 mmol/L (ref 22–32)
Calcium: 8.8 mg/dL — ABNORMAL LOW (ref 8.9–10.3)
Chloride: 104 mmol/L (ref 98–111)
Creatinine, Ser: 0.46 mg/dL — ABNORMAL LOW (ref 0.61–1.24)
GFR calc Af Amer: >60 mL/min (ref >60)
GFR calc non Af Amer: >60 mL/min (ref >60)
Glucose, Bld: 82 mg/dL (ref 70–99)
Potassium: 3.7 mmol/L (ref 3.5–5.1)
Sodium: 142 mmol/L (ref 135–145)
Total Bilirubin: 1.4 mg/dL — ABNORMAL HIGH (ref 0.3–1.2)
Total Protein: 6.4 g/dL — ABNORMAL LOW (ref 6.5–8.1)

## 2019-02-12 LAB — CBC WITH DIFFERENTIAL/PLATELET
Abs Immature Granulocytes: 0.02 10*3/uL (ref 0.00–0.07)
Basophils Absolute: 0 10*3/uL (ref 0.0–0.1)
Basophils Relative: 0 %
Eosinophils Absolute: 0.1 10*3/uL (ref 0.0–0.5)
Eosinophils Relative: 2 %
HCT: 45.9 % (ref 39.0–52.0)
Hemoglobin: 14.8 g/dL (ref 13.0–17.0)
Immature Granulocytes: 0 %
Lymphocytes Relative: 31 %
Lymphs Abs: 2 10*3/uL (ref 0.7–4.0)
MCH: 29.4 pg (ref 26.0–34.0)
MCHC: 32.2 g/dL (ref 30.0–36.0)
MCV: 91.1 fL (ref 80.0–100.0)
Monocytes Absolute: 0.5 10*3/uL (ref 0.1–1.0)
Monocytes Relative: 8 %
Neutro Abs: 3.8 10*3/uL (ref 1.7–7.7)
Neutrophils Relative %: 59 %
Platelets: 152 10*3/uL (ref 150–400)
RBC: 5.04 MIL/uL (ref 4.22–5.81)
RDW: 13.2 % (ref 11.5–15.5)
WBC: 6.4 10*3/uL (ref 4.0–10.5)
nRBC: 0 % (ref 0.0–0.2)

## 2019-02-12 LAB — LIPID PANEL
Cholesterol: 146 mg/dL (ref 0–200)
HDL: 24 mg/dL — ABNORMAL LOW (ref >40)
LDL Cholesterol: 90 mg/dL (ref 0–99)
Total CHOL/HDL Ratio: 6.1 RATIO
Triglycerides: 162 mg/dL — ABNORMAL HIGH (ref <150)
VLDL: 32 mg/dL (ref 0–40)

## 2019-02-12 LAB — TSH: TSH: 0.937 u[IU]/mL (ref 0.350–4.500)

## 2019-02-12 LAB — HEMOGLOBIN A1C
Hgb A1c MFr Bld: 5.1 % (ref 4.8–5.6)
Mean Plasma Glucose: 99.67 mg/dL

## 2019-02-12 LAB — MAGNESIUM: Magnesium: 2.2 mg/dL (ref 1.7–2.4)

## 2019-02-21 ENCOUNTER — Encounter: Payer: Self-pay | Admitting: Physician Assistant

## 2019-02-21 ENCOUNTER — Ambulatory Visit: Payer: Self-pay | Admitting: Physician Assistant

## 2019-02-21 DIAGNOSIS — F17200 Nicotine dependence, unspecified, uncomplicated: Secondary | ICD-10-CM

## 2019-02-21 DIAGNOSIS — R531 Weakness: Secondary | ICD-10-CM

## 2019-02-21 DIAGNOSIS — R479 Unspecified speech disturbances: Secondary | ICD-10-CM

## 2019-02-21 NOTE — Progress Notes (Signed)
There were no vitals taken for this visit.   Subjective:    Patient ID: Timothy Barrera, male    DOB: 05/02/1985, 33 y.o.   MRN: [id number]  HPI: Timothy Barrera is a 33 y.o. male presenting on 02/21/2019 for No chief complaint on file.   HPI  This is a telemedicine appointment through [employer] due to coronavirus pandemic  I connected with  [name] on 02/21/19 by a video enabled telemedicine application and verified that I am speaking with the correct person using two identifiers.   I discussed the limitations of evaluation and management by telemedicine. The patient expressed understanding and agreed to proceed.  Pt is at home.  Provider is at office.    Pt is a 33yo M who presented as a new patient in October with complaints of his knees giving out causing him to fall at times.   He says this had been going on for several years.  Pt was sent for ABI testing and was referred to neurology for further evaluation and treatment of weakness.    Pt was given application for [employer] financial assistance.  He says he has not yet submitted the application   He is still smoking.   Relevant past medical, surgical, family and social history reviewed and updated as indicated. Interim medical history since our last visit reviewed. Allergies and medications reviewed and updated.  No current outpatient medications on file.   Review of Systems  Per HPI unless specifically indicated above     Objective:    There were no vitals taken for this visit.  Wt Readings from Last 3 Encounters:  10/23/18 240 lb (108.9 kg)  09/12/17 220 lb (99.8 kg)  09/11/17 220 lb (99.8 kg)    Physical Exam Constitutional:      General: He is not in acute distress.    Comments: Speech impediment  HENT:     Head: Normocephalic and atraumatic.  Pulmonary:     Effort: No respiratory distress.  Neurological:     Mental Status: He is alert and oriented to person, place, and time.   Psychiatric:        Attention and Perception: Attention normal.        Mood and Affect: Affect is angry.        Behavior: Behavior is cooperative.     Results for orders placed or performed during the hospital encounter of 02/12/19  CBC w/Diff/Platelet  Result Value Ref Range   WBC 6.4 4.0 - 10.5 K/uL   RBC 5.04 4.22 - 5.81 MIL/uL   Hemoglobin 14.8 13.0 - 17.0 g/dL   HCT 45.9 39.0 - 52.0 %   MCV 91.1 80.0 - 100.0 fL   MCH 29.4 26.0 - 34.0 pg   MCHC 32.2 30.0 - 36.0 g/dL   RDW 13.2 11.5 - 15.5 %   Platelets 152 150 - 400 K/uL   nRBC 0.0 0.0 - 0.2 %   Neutrophils Relative % 59 %   Neutro Abs 3.8 1.7 - 7.7 K/uL   Lymphocytes Relative 31 %   Lymphs Abs 2.0 0.7 - 4.0 K/uL   Monocytes Relative 8 %   Monocytes Absolute 0.5 0.1 - 1.0 K/uL   Eosinophils Relative 2 %   Eosinophils Absolute 0.1 0.0 - 0.5 K/uL   Basophils Relative 0 %   Basophils Absolute 0.0 0.0 - 0.1 K/uL   Immature Granulocytes 0 %   Abs Immature Granulocytes 0.02 0.00 - 0.07 K/uL  Magnesium  Result  Value Ref Range   Magnesium 2.2 1.7 - 2.4 mg/dL  TSH  Result Value Ref Range   TSH 0.937 0.350 - 4.500 uIU/mL  Lipid panel  Result Value Ref Range   Cholesterol 146 0 - 200 mg/dL   Triglycerides 944 (H) <150 mg/dL   HDL 24 (L) >96 mg/dL   Total CHOL/HDL Ratio 6.1 RATIO   VLDL 32 0 - 40 mg/dL   LDL Cholesterol 90 0 - 99 mg/dL  Hemoglobin P5F  Result Value Ref Range   Hgb A1c MFr Bld 5.1 4.8 - 5.6 %   Mean Plasma Glucose 99.67 mg/dL  Comprehensive metabolic panel  Result Value Ref Range   Sodium 142 135 - 145 mmol/L   Potassium 3.7 3.5 - 5.1 mmol/L   Chloride 104 98 - 111 mmol/L   CO2 30 22 - 32 mmol/L   Glucose, Bld 82 70 - 99 mg/dL   BUN 8 6 - 20 mg/dL   Creatinine, Ser 1.63 (L) 0.61 - 1.24 mg/dL   Calcium 8.8 (L) 8.9 - 10.3 mg/dL   Total Protein 6.4 (L) 6.5 - 8.1 g/dL   Albumin 3.7 3.5 - 5.0 g/dL   AST 37 15 - 41 U/L   ALT 47 (H) 0 - 44 U/L   Alkaline Phosphatase 48 38 - 126 U/L   Total Bilirubin  1.4 (H) 0.3 - 1.2 mg/dL   GFR calc non Af Amer >60 >60 mL/min   GFR calc Af Amer >60 >60 mL/min   Anion gap 8 5 - 15      Assessment & Plan:   Encounter Diagnoses  Name Primary?  . Weakness Yes  . Tobacco use disorder   . Speech impediment       Pt has neurology appt in January.  He says he doesn't know if he is going or not because they told him to bring $200.  Discussed with pt that this is why he was given the application for [employer] financial assistance.  Discussed with him that it takes time and he should have already submitted it weeks ago.  He needs to get moving on this right away.  He says he doesn't know if he can find the paper now.   We will mail the pt another application  Reviewed labs with pt  Reviewed ABI normal with pt  Encouraged pt to wear mask when out in public to reduce risk of covid 19  Encouraged smoking cessation  Pt will follow up here after his neurology appt.  He is contact office sooner prn

## 2019-04-09 ENCOUNTER — Ambulatory Visit: Payer: Self-pay | Admitting: Neurology

## 2019-04-30 ENCOUNTER — Ambulatory Visit: Payer: Self-pay | Admitting: Physician Assistant

## 2019-04-30 ENCOUNTER — Encounter: Payer: Self-pay | Admitting: Physician Assistant

## 2019-04-30 DIAGNOSIS — F17200 Nicotine dependence, unspecified, uncomplicated: Secondary | ICD-10-CM

## 2019-04-30 DIAGNOSIS — R531 Weakness: Secondary | ICD-10-CM

## 2019-04-30 DIAGNOSIS — R479 Unspecified speech disturbances: Secondary | ICD-10-CM

## 2019-04-30 NOTE — Progress Notes (Signed)
There were no vitals taken for this visit.   Subjective:    Patient ID: Timothy Barrera, male    DOB: 07-Sep-1985, 34 y.o.   MRN: [id number]  HPI: Timothy Barrera is a 34 y.o. male presenting on 04/30/2019 for No chief complaint on file.   HPI     This is a telemedicine appointment through [facility] due to coronavirus pandemic  I connected with  Timothy Barrera on 04/30/19 by a video enabled telemedicine application and verified that I am speaking with the correct person using two identifiers.   I discussed the limitations of evaluation and management by telemedicine. The patient expressed understanding and agreed to proceed.  Pt is at home.  Provider is at office.      Pt is 33yoM who was referred to neurologist for evaluation of weakness that is interfering with his ability to walk and also involves weakness in the upper extremities.    Pt was originally referred in October.  He was given application for financial assistance through [facility] at that time.   Pt has still not submitted the application.    Pt had appointment with neurologist in January that he did not go to.  It appears he told the office that he was having car problems.  Pt has rescheduled and now has appointment for February.    Pt has no new complaints today.     Relevant past medical, surgical, family and social history reviewed and updated as indicated. Interim medical history since our last visit reviewed. Allergies and medications reviewed and updated.   No current outpatient medications on file.    Review of Systems  Per HPI unless specifically indicated above     Objective:    There were no vitals taken for this visit.  Wt Readings from Last 3 Encounters:  10/23/18 240 lb (108.9 kg)  09/12/17 220 lb (99.8 kg)  09/11/17 220 lb (99.8 kg)    Physical Exam Constitutional:      General: He is not in acute distress.    Appearance: He is not ill-appearing.  HENT:     Head: Normocephalic  and atraumatic.  Pulmonary:     Effort: Pulmonary effort is normal. No respiratory distress.  Neurological:     Mental Status: He is alert and oriented to person, place, and time.  Psychiatric:        Attention and Perception: Attention normal.        Behavior: Behavior is cooperative.           Assessment & Plan:    Encounter Diagnoses  Name Primary?  . Weakness Yes  . Tobacco use disorder   . Speech impediment        Pt says he is upset about having to pay for neurology appointment.  Discussed with pt that we have provided him with the means to get services at no cost but that he has thus far chosen not to make use of them.   Discussed that he must submit the application for financial assistance through [facility] charity care if he wants to get assistance with paying for his neurology appointment.   We Will mail another application to the patient.  He is to go to Neurology in february as scheduled.  He is told that if he isn't going to go to the appointment, he should call at least 48 hour in advance and notify them.  Pt is scheduled for follow up in 2 months.  He is to contact  office sooner prn

## 2019-05-17 ENCOUNTER — Ambulatory Visit: Payer: Self-pay | Admitting: Neurology

## 2019-06-27 ENCOUNTER — Ambulatory Visit: Payer: Self-pay | Admitting: Neurology

## 2019-06-27 ENCOUNTER — Encounter: Payer: Self-pay | Admitting: Neurology

## 2019-06-27 ENCOUNTER — Telehealth: Payer: Self-pay | Admitting: Physician Assistant

## 2019-06-27 NOTE — Telephone Encounter (Signed)
Called pt after seeing that he had another no-show/cancel <24 hours with neurologist.  He told me he didn't have gas money.  I asked pt if he had turned in application for [employer] care and he says he did but hasn't heard if he was approved.   I offered to find out for him if he was approved but he said no, he did not want me to check on it.   I told pt that he needs to make it to his next scheduled appointment with neurology or they might not be able to see him due to his many no-shows/cancel < 24 hours.  He states understanding.

## 2019-07-09 ENCOUNTER — Ambulatory Visit: Payer: Self-pay | Admitting: Physician Assistant

## 2019-07-09 DIAGNOSIS — F17200 Nicotine dependence, unspecified, uncomplicated: Secondary | ICD-10-CM

## 2019-07-09 DIAGNOSIS — R531 Weakness: Secondary | ICD-10-CM

## 2019-07-09 DIAGNOSIS — Z9119 Patient's noncompliance with other medical treatment and regimen: Secondary | ICD-10-CM

## 2019-07-09 DIAGNOSIS — Z91199 Patient's noncompliance with other medical treatment and regimen due to unspecified reason: Secondary | ICD-10-CM

## 2019-07-09 DIAGNOSIS — Z2821 Immunization not carried out because of patient refusal: Secondary | ICD-10-CM

## 2019-07-09 DIAGNOSIS — R479 Unspecified speech disturbances: Secondary | ICD-10-CM
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
## 2019-07-10 ENCOUNTER — Encounter: Payer: Self-pay | Admitting: Physician Assistant

## 2019-07-25 ENCOUNTER — Encounter: Payer: Self-pay | Admitting: Neurology

## 2019-08-08 ENCOUNTER — Ambulatory Visit: Payer: Self-pay | Admitting: Neurology

## 2019-09-27 IMAGING — DX DG NECK SOFT TISSUE
2 series · 2 of 2 positions shown · non-contrast
Comparison: None.

CLINICAL DATA: Feels like something is stuck in the throat

EXAM:
NECK SOFT TISSUES - 1+ VIEW

[neck lat]
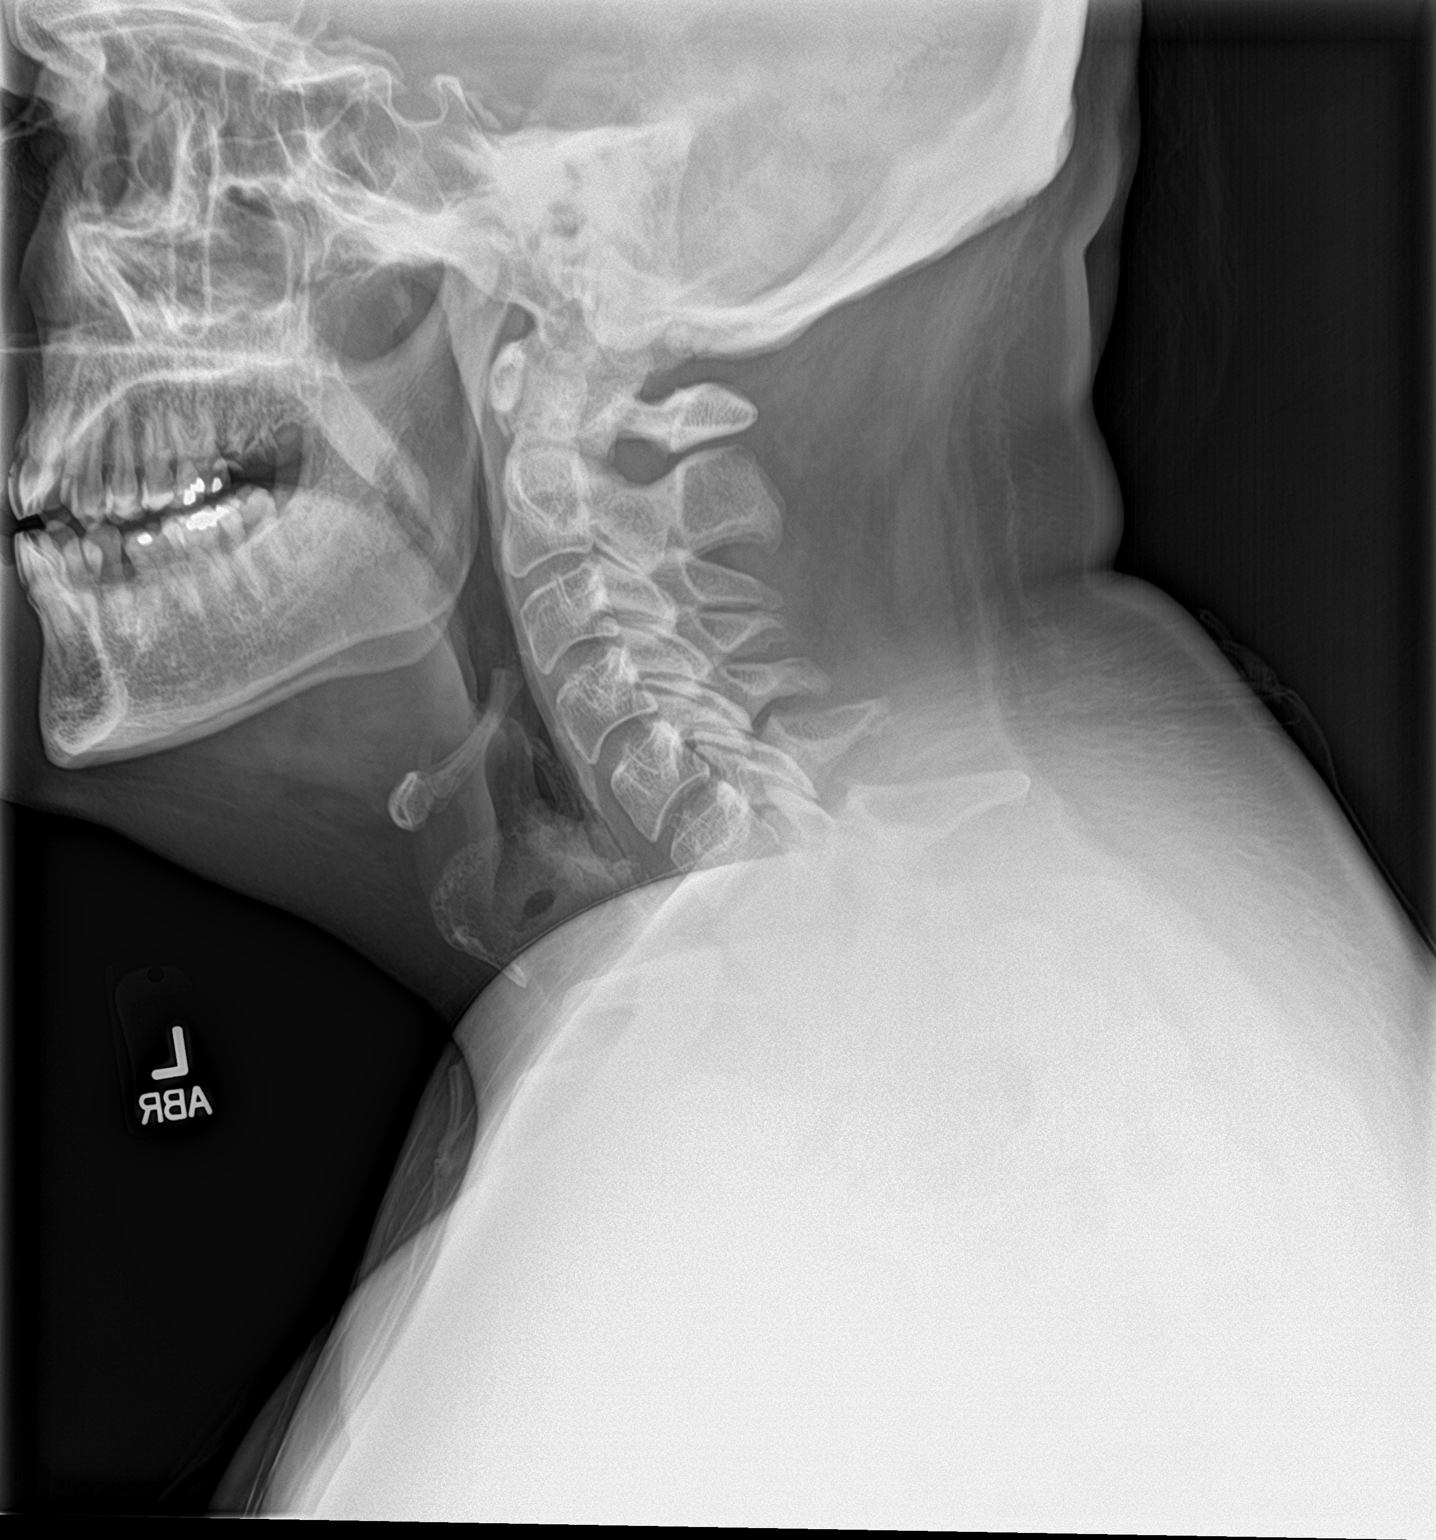

[neck ap]
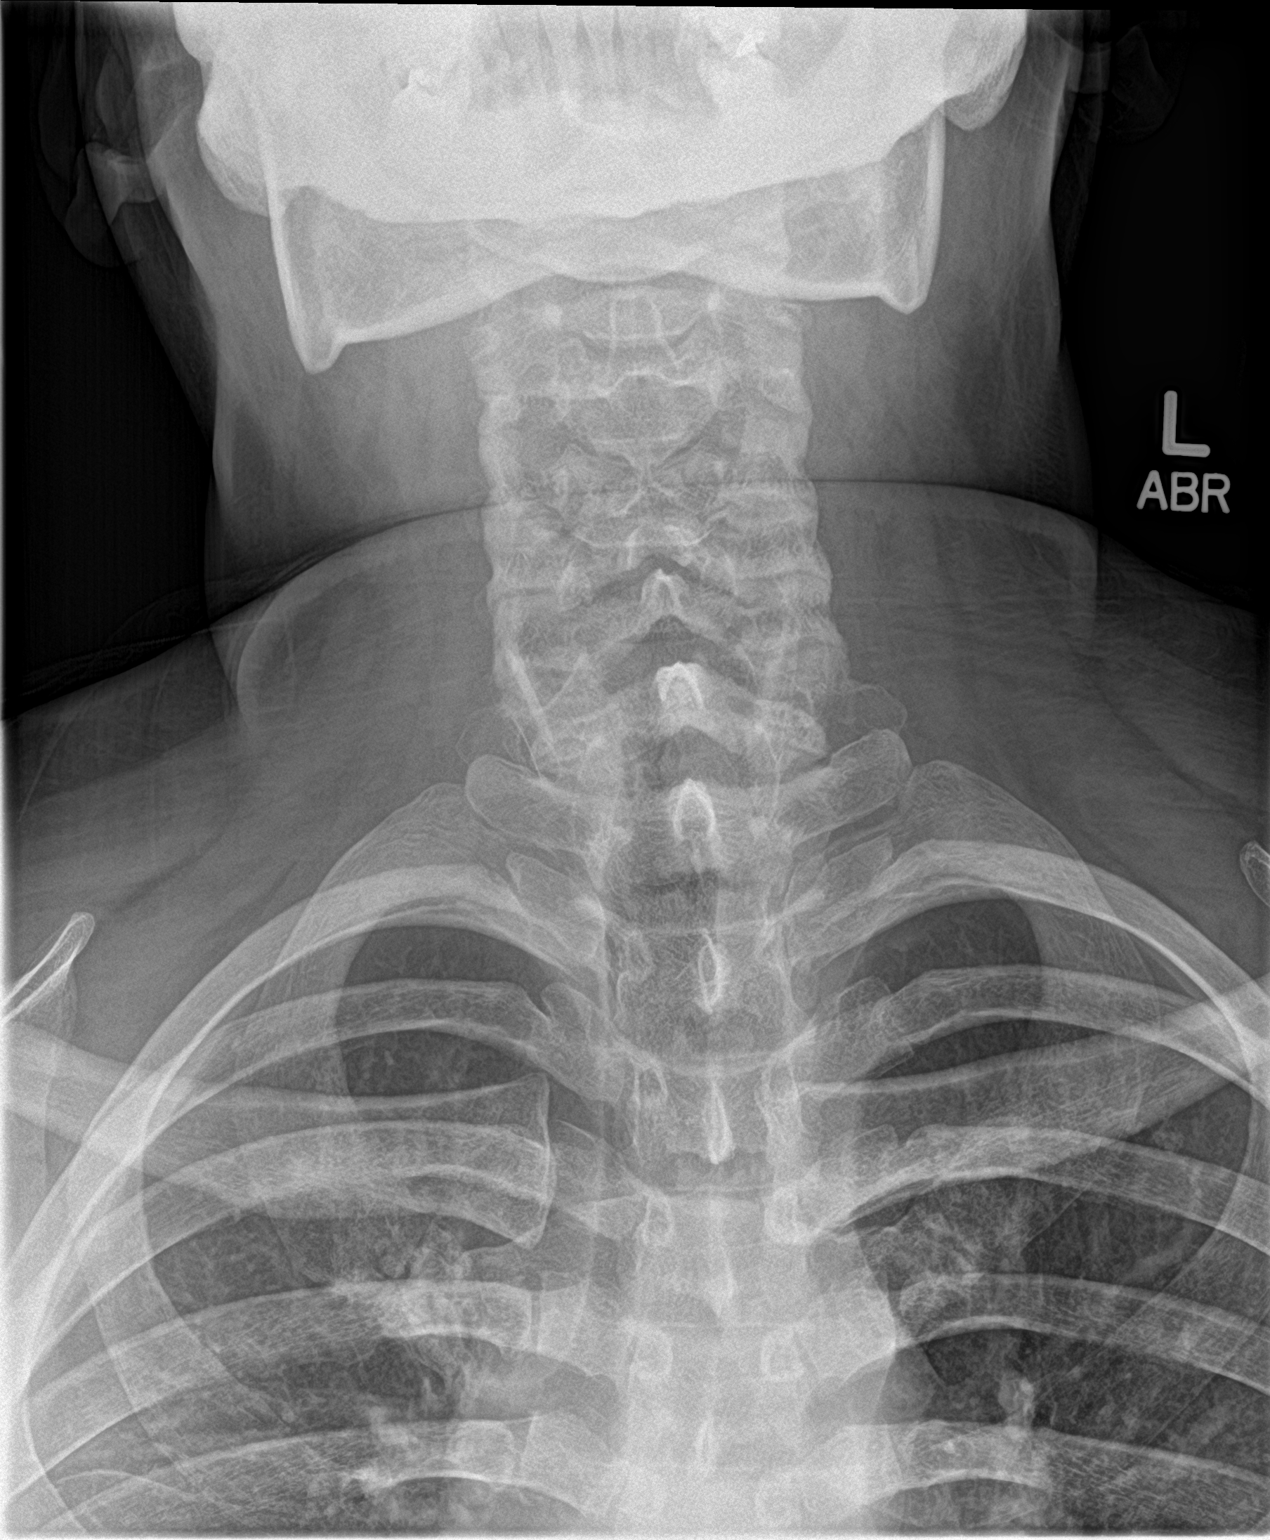

[2 of 2 positions shown; findings below may reference images not displayed]

FINDINGS: There is no evidence of retropharyngeal soft tissue swelling or
epiglottic enlargement. The cervical airway is unremarkable and no
radio-opaque foreign body identified.
IMPRESSION: Negative.
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
## 2019-10-05 ENCOUNTER — Ambulatory Visit: Payer: Self-pay | Admitting: Neurology

## 2019-10-05 NOTE — Progress Notes (Signed)
NEUROLOGY CONSULTATION NOTE  Timothy Barrera MRN: [id number] DOB: [birth year]  Referring provider: Jacquelin Hawking, PA-C Primary care provider: Jacquelin Hawking, PA-C  Reason for consult:  weakness  HISTORY OF PRESENT ILLNESS: Timothy Barrera is a 34 year old right-handed male who presents for weakness.  History supplemented by referring provider's notes.  He started having problems with bilateral leg weakness after his father passed away 2 years ago.  Sometimes when he walks, he notes sharp pain in the right knee.  He notes swelling in the legs.  He notes some tightness in the back of his right knee.  He has fallen.  No back pain, radicular pain in the legs or numbness.  Reports some difficulty swallowing pills.  Has trouble opening jars.  Sometimes his hands get stuck.  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  Arterial ABI in October was negative.   Family history of muscular dystrophy:  Maternal grandfather, brother (myotonic dystrophy), first cousin on mother's side   02/12/2019 LABS:  CBC with WBC 6.4, HGB 14.8, HCT 45.9, PLT 152; CMP with Na 142, K 3.7, Cl 104, CO2 30, glucose 82, BUN 8. Cr 0.46, Ca 8.8, t bili 1.4, ALP 48, AST 37, ALT 47; TSH 0.937; HGB A1c 5.1  PAST MEDICAL HISTORY: Past Medical History:  Diagnosis Date  . Speech impediment     PAST SURGICAL HISTORY: Past Surgical History:  Procedure Laterality Date  . HERNIA REPAIR      MEDICATIONS: No outpatient encounter medications on file as of 10/09/2019.   No facility-administered encounter medications on file as of 10/09/2019.   ALLERGIES: No Known Allergies  FAMILY HISTORY: History reviewed. No pertinent family history.   SOCIAL HISTORY: Social History   Socioeconomic History  . Marital status: Single    Spouse name: Not on file  . Number of children: Not on file  . Years of education: Not on file  . Highest education level: Not on file  Occupational History  . Not on  file  Tobacco Use  . Smoking status: Current Every Day Smoker    Packs/day: 0.50    Types: Cigarettes  . Smokeless tobacco: Never Used  Vaping Use  . Vaping Use: Never used  Substance and Sexual Activity  . Alcohol use: Yes    Comment: socially  . Drug use: No  . Sexual activity: Yes    Birth control/protection: None  Other Topics Concern  . Not on file  Social History Narrative  . Not on file   Social Determinants of Health   Financial Resource Strain:   . Difficulty of Paying Living Expenses:   Food Insecurity:   . Worried About Programme researcher, broadcasting/film/video in the Last Year:   . Barista in the Last Year:   Transportation Needs:   . Freight forwarder (Medical):   Marland Kitchen Lack of Transportation (Non-Medical):   Physical Activity:   . Days of Exercise per Week:   . Minutes of Exercise per Session:   Stress:   . Feeling of Stress :   Social Connections:   . Frequency of Communication with Friends and Family:   . Frequency of Social Gatherings with Friends and Family:   . Attends Religious Services:   . Active Member of Clubs or Organizations:   . Attends Banker Meetings:   Marland Kitchen Marital Status:   Intimate Partner Violence:   . Fear of Current or Ex-Partner:   . Emotionally Abused:   .  Physically Abused:   . Sexually Abused:     PHYSICAL EXAM: Blood pressure 120/79, pulse 80, height 6\' 1"  (1.854 m), weight 243 lb 12.8 oz (110.6 kg), SpO2 93 %. General: No acute distress.  Patient appears well-groomed.   Head:  Normocephalic/atraumatic.  Bi-temporal wasting, frontal balding.   Eyes:  fundi examined but not visualized Neck: supple, no paraspinal tenderness, full range of motion Back: No paraspinal tenderness Heart: regular rate and rhythm Lungs: Clear to auscultation bilaterally. Vascular: No carotid bruits. Neurological Exam: Mental status: alert and oriented to person, place, and time, recent and remote memory intact, fund of knowledge intact, attention  and concentration intact, speech fluent and not dysarthric, language intact. Cranial nerves: CN I: not tested CN II: pupils equal, round and reactive to light, visual fields intact CN III, IV, VI:  full range of motion, no nystagmus, no ptosis CN V: facial sensation intact CN VII: upper and lower face symmetric CN VIII: hearing intact CN IX, X: gag intact, uvula midline CN XI: sternocleidomastoid and trapezius muscles intact CN XII: tongue midline Bulk & Tone: normal, no fasciculations. Motor:  4+/5 triceps, biceps, grip, ankle dorsiflexion; otherwise 5/5 throughout Sensation:  Pinprick and vibration sensation intact. Deep Tendon Reflexes:  1+ throughout, toes downgoing.   Finger to nose testing:  Without dysmetria.   Heel to shin:  Without dysmetria.   Gait:  Mildly wide-based gait.  Unable to tandem walk.  Romberg negative.  IMPRESSION: Bilateral lower extremity weakness with family history of myotonic dystrophy.  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  PLAN: 1.  Check CK and aldolase 2.  Check NCV-EMG 3.  Follow up after testing.  Thank you for allowing me to take part in the care of this patient.  , DO  CC: Shon Millet, PA-C

## 2019-10-09 ENCOUNTER — Other Ambulatory Visit: Payer: Self-pay

## 2019-10-09 ENCOUNTER — Ambulatory Visit (INDEPENDENT_AMBULATORY_CARE_PROVIDER_SITE_OTHER): Payer: Self-pay | Admitting: Neurology

## 2019-10-09 ENCOUNTER — Encounter: Payer: Self-pay | Admitting: Neurology

## 2019-10-09 VITALS — BP 120/79 | HR 80 | Ht 73.0 in | Wt 243.8 lb

## 2019-10-09 DIAGNOSIS — R29898 Other symptoms and signs involving the musculoskeletal system: Secondary | ICD-10-CM

## 2019-10-09 DIAGNOSIS — Z820 Family history of epilepsy and other diseases of the nervous system: Secondary | ICD-10-CM

## 2019-10-09 DIAGNOSIS — Z8269 Family history of other diseases of the musculoskeletal system and connective tissue: Secondary | ICD-10-CM

## 2019-10-09 NOTE — Patient Instructions (Signed)
1.  Will check nerve conduction study and EMG 2.  Will check CK and aldolase

## 2019-10-10 LAB — CK: Total CK: 132 U/L (ref 7–232)

## 2019-10-10 LAB — ALDOLASE: Aldolase: 7.9 U/L (ref <=8.1)

## 2019-10-11 ENCOUNTER — Telehealth: Payer: Self-pay

## 2019-10-11 NOTE — Telephone Encounter (Signed)
Left VM letting pt know that, per Dr Everlena Cooper, blood work is normal.

## 2019-11-19 ENCOUNTER — Other Ambulatory Visit: Payer: Self-pay

## 2019-11-19 ENCOUNTER — Encounter (HOSPITAL_COMMUNITY): Payer: Self-pay | Admitting: Emergency Medicine

## 2019-11-19 DIAGNOSIS — K122 Cellulitis and abscess of mouth: Secondary | ICD-10-CM | POA: Insufficient documentation

## 2019-11-19 NOTE — ED Triage Notes (Signed)
Pt c/o sore throat since Friday. 

## 2019-11-20 ENCOUNTER — Emergency Department (HOSPITAL_COMMUNITY)
Admission: EM | Admit: 2019-11-20 | Discharge: 2019-11-20 | Disposition: A | Discharge status: Home / Self Care | Payer: Self-pay | Attending: Emergency Medicine | Admitting: Emergency Medicine

## 2019-11-20 ENCOUNTER — Emergency Department (HOSPITAL_COMMUNITY): Payer: Self-pay

## 2019-11-20 DIAGNOSIS — K122 Cellulitis and abscess of mouth: Secondary | ICD-10-CM

## 2019-11-20 LAB — GROUP A STREP BY PCR: Group A Strep by PCR: NOT DETECTED

## 2019-11-20 MED ORDER — AMOXICILLIN 400 MG/5ML PO SUSR
500.0000 mg | Freq: Three times a day (TID) | ORAL | 0 refills | Status: AC
Start: 2019-11-20 — End: 2019-11-27

## 2019-11-20 MED ORDER — DEXAMETHASONE SODIUM PHOSPHATE 10 MG/ML IJ SOLN
10.0000 mg | Freq: Once | INTRAMUSCULAR | Status: AC
  Administered 2019-11-20: 10 mg via INTRAMUSCULAR
  Filled 2019-11-20: qty 1

## 2019-11-20 MED ORDER — AMOXICILLIN 250 MG/5ML PO SUSR
500.0000 mg | Freq: Once | ORAL | Status: AC
  Administered 2019-11-20: 500 mg via ORAL
  Filled 2019-11-20: qty 10

## 2019-11-20 NOTE — ED Provider Notes (Signed)
[facility] EMERGENCY DEPARTMENT Provider Note   CSN: [id number] Arrival date & time: 11/19/19  1838     History Chief Complaint  Patient presents with  . Sore Throat    Timothy Barrera is a 34 y.o. male.  Patient with history of speech impediment.  States his voice is unchanged today.  Complains of sore throat for the past 3 days worse on the right side and pain with swallowing.  No fevers, chills, nausea or vomiting.  No chest pain or shortness of breath.  No cough, runny nose.  History of similar problem in the past.  He has been trying salt water rinses at home without relief. Currently being worked up by a neurologist for possibility of myotonic dystrophy which runs in his family.  The history is provided by the patient.  Sore Throat Pertinent negatives include no chest pain, no abdominal pain, no headaches and no shortness of breath.       Past Medical History:  Diagnosis Date  . Speech impediment     There are no problems to display for this patient.   Past Surgical History:  Procedure Laterality Date  . HERNIA REPAIR         No family history on file.  Social History   Tobacco Use  . Smoking status: Current Every Day Smoker    Packs/day: 0.50    Types: Cigarettes  . Smokeless tobacco: Never Used  Vaping Use  . Vaping Use: Never used  Substance Use Topics  . Alcohol use: Yes    Comment: socially  . Drug use: No    Home Medications Prior to Admission medications   Not on File    Allergies    Patient has no known allergies.  Review of Systems   Review of Systems  Constitutional: Negative for activity change, appetite change, fatigue and fever.  HENT: Positive for sore throat and trouble swallowing. Negative for congestion, ear pain and voice change.   Eyes: Negative for visual disturbance.  Respiratory: Negative for cough and shortness of breath.   Cardiovascular: Negative for chest pain.  Gastrointestinal: Negative for abdominal pain,  nausea and vomiting.  Genitourinary: Negative for dysuria and hematuria.  Musculoskeletal: Negative for arthralgias and myalgias.  Skin: Negative for rash.  Neurological: Negative for dizziness, weakness and headaches.   all other systems are negative except as noted in the HPI and PMH.    Physical Exam Updated Vital Signs BP (!) 140/95 (BP Location: Right Arm)   Pulse 94   Temp 98.6 F (37 C) (Oral)   Resp 18   Ht 6\' 1"  (1.854 m)   Wt 108.9 kg   SpO2 97%   BMI 31.66 kg/m   Physical Exam Vitals and nursing note reviewed.  Constitutional:      General: He is not in acute distress.    Appearance: He is well-developed.     Comments: Speech impediment at baseline by his report  HENT:     Head: Normocephalic and atraumatic.     Mouth/Throat:     Pharynx: Posterior oropharyngeal erythema present.     Comments: mild inflammation of the uvula.  There is no trismus or drooling.  No asymmetry of tonsils or soft palate.  Floor of mouth is soft.  Controlling secretions Eyes:     Conjunctiva/sclera: Conjunctivae normal.     Pupils: Pupils are equal, round, and reactive to light.  Neck:     Comments: No meningismus. Cardiovascular:     Rate and  Rhythm: Normal rate and regular rhythm.     Heart sounds: Normal heart sounds. No murmur heard.   Pulmonary:     Effort: Pulmonary effort is normal. No respiratory distress.     Breath sounds: Normal breath sounds.  Abdominal:     Palpations: Abdomen is soft.     Tenderness: There is no abdominal tenderness. There is no guarding or rebound.  Musculoskeletal:        General: No tenderness. Normal range of motion.     Cervical back: Normal range of motion and neck supple.  Lymphadenopathy:     Cervical: Cervical adenopathy present.  Skin:    General: Skin is warm.  Neurological:     Mental Status: He is alert and oriented to person, place, and time.     Cranial Nerves: No cranial nerve deficit.     Motor: No abnormal muscle tone.      Coordination: Coordination normal.     Comments: No ataxia on finger to nose bilaterally. No pronator drift. 5/5 strength throughout. CN 2-12 intact.Equal grip strength. Sensation intact.   Psychiatric:        Behavior: Behavior normal.     ED Results / Procedures / Treatments   Labs (all labs ordered are listed, but only abnormal results are displayed) Labs Reviewed  GROUP A STREP BY PCR    EKG None  Radiology DG Neck Soft Tissue  Result Date: 11/20/2019 CLINICAL DATA:  Sore throat.  Uvulitis. EXAM: NECK SOFT TISSUES - 1+ VIEW COMPARISON:  04/16/2017, CT 04/20/2017 FINDINGS: There is no evidence of retropharyngeal soft tissue swelling or epiglottic enlargement. The cervical airway is unremarkable. No radio-opaque foreign body identified. Soft tissue planes are non suspicious. IMPRESSION: Negative soft tissue neck radiographs.  No change from prior exam. Electronically Signed   By: Narda Rutherford M.D.   On: 11/20/2019 03:38    Procedures Procedures (including critical care time)  Medications Ordered in ED Medications  amoxicillin (AMOXIL) 250 MG/5ML suspension 500 mg (has no administration in time range)  dexamethasone (DECADRON) injection 10 mg (has no administration in time range)    ED Course  I have reviewed the triage vital signs and the nursing notes.  Pertinent labs & imaging results that were available during my care of the patient were reviewed by me and considered in my medical decision making (see chart for details).    MDM Rules/Calculators/A&P                         Uvulitis on exam without evidence of peritonsillar abscess or retropharyngeal abscess.  Controlling secretions.  Airway is patent.  We will treat supportively with antibiotics as well as give 1 shot of steroids to help with inflammation.  Strep is negative.  Tissue neck x-ray is negative.  Normal epiglottis.  No retropharyngeal thickening.  Antibiotics are liquid by his request.  PCP  follow-up.  Tylenol or Motrin as needed.  Return to the ED with difficulty breathing, difficulty swallowing, or any other concerns. Final Clinical Impression(s) / ED Diagnoses Final diagnoses:  Uvulitis    Rx / DC Orders ED Discharge Orders    None       Hershal Eriksson, Jeannett Senior, MD 11/20/19 [phone]

## 2019-11-20 NOTE — Discharge Instructions (Signed)
Take the antibiotics as prescribed.  Use Tylenol or Motrin as needed for pain.  Follow-up with your doctor.  Return to the ED with difficulty breathing, difficulty swallowing, or other concerns.

## 2019-12-04 ENCOUNTER — Encounter: Payer: Self-pay | Admitting: Neurology

## 2020-01-01 ENCOUNTER — Ambulatory Visit (INDEPENDENT_AMBULATORY_CARE_PROVIDER_SITE_OTHER): Payer: Self-pay | Admitting: Neurology

## 2020-01-01 ENCOUNTER — Other Ambulatory Visit: Payer: Self-pay

## 2020-01-01 DIAGNOSIS — Z820 Family history of epilepsy and other diseases of the nervous system: Secondary | ICD-10-CM

## 2020-01-01 DIAGNOSIS — Z8269 Family history of other diseases of the musculoskeletal system and connective tissue: Secondary | ICD-10-CM

## 2020-01-01 DIAGNOSIS — G7111 Myotonic muscular dystrophy: Secondary | ICD-10-CM

## 2020-01-01 NOTE — Procedures (Signed)
[facility]  [address] [place], [address]  [place], [place] [address] Tel: [phone] Fax:  [phone] Test Date:  01/01/2020  Patient: Timothy Barrera DOB: [birth year] Physician: Nita Sickle, DO  Sex: Male Height: 6\' 1"  Ref Phys: , D.O.  ID#: Shon Millet Temp: 32.0C Technician:    Patient Complaints: This is a 34 year old man with family history of myotonic dystrophy referred for evaluation of bilateral leg weakness.  NCV & EMG Findings: Extensive electrodiagnostic testing of the right lower extremity and additional studies of the right upper extremity shows:  1. Right sural and superficial peroneal sensory responses are absent. 2. Right peroneal motor response at the extensor digitorum brevis is absent.  Right peroneal (tibialis anterior) and tibial motor responses show reduced amplitude. 3. Right tibial H reflex study is absent. 4. Myopathic motor units are seen throughout the right upper and lower extremities, most notably distally where there is also muscle irritability and diffuse myotonic discharges.  Impression: 1. 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 2. 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     ___________________________ 32, DO    Nerve Conduction Studies Anti Sensory Summary Table   Stim Site NR Peak (ms) Norm Peak (ms) P-T Amp (V) Norm P-T Amp  Right Sup Peroneal Anti Sensory (Ant Lat Mall)  32C  12 cm NR  <4.5  >5  Right Sural Anti Sensory (Lat Mall)  32C  Calf NR  <4.5  >5   Motor Summary Table   Stim Site NR Onset (ms) Norm Onset (ms) O-P Amp (mV) Norm O-P Amp Site1 Site2 Delta-0 (ms) Dist (cm) Vel (m/s) Norm Vel (m/s)  Right Peroneal Motor (Ext Dig Brev)  32C  Ankle NR  <5.5  >3 B Fib Ankle  0.0  >40  B Fib NR     Poplt B Fib  0.0  >40  Poplt NR            Right  Peroneal TA Motor (Tib Ant)  32C  Fib Head    3.6 <4.0 1.2 >4 Poplit Fib Head 1.7 8.0 47 >40  Poplit    5.3  1.1         Right Tibial Motor (Abd Hall Brev)  32C  Ankle    1.5 <6.0 4.6 >8 Knee Ankle 9.7 44.0 45 >40  Knee    11.2  2.7          H Reflex Studies   NR H-Lat (ms) Lat Norm (ms) L-R H-Lat (ms)  Right Tibial (Gastroc)  32C  NR  <35    EMG   Side Muscle Ins Act Fibs Psw Fasc Number Recrt Dur Dur. Amp Amp. Poly Poly. Comment  Right AntTibialis MTP 2+ Nml Nml Nml Nml Most 1+ Many 1- Many 1+ E.R.  Right Gastroc MTP 1+ Nml Nml Nml Nml Most 1+ Most 1- Most 1+ E.R.  Right RectFemoris MTP 1+ Nml Nml Nml Nml Many 1+ Some 1- Many 1+ E.R.  Right GluteusMed Nml Nml Nml Nml Nml Nml Nml Nml Nml Nml Nml Nml N/A  Right BicepsFemS Nml Nml Nml Nml Nml Nml Nml Nml Nml Nml Nml Nml N/A  Right 1stDorInt MTP 1+ Nml Nml Nml Nml Many 1+ Many 1- Many 1+ E.R.  Right Ext Indicis MTP 1+ Nml Nml Nml Nml Some 1+ Some 1- Some 1+ E.R.  Right Triceps Nml Nml Nml Nml  Nml Nml Nml Nml Nml Nml Nml Nml N/A  Right Deltoid Nml Nml Nml Nml Nml Nml Some 1+ Some 1- Some 1+ N/A      Waveforms:

## 2020-01-02 ENCOUNTER — Other Ambulatory Visit: Payer: Self-pay

## 2020-01-02 ENCOUNTER — Telehealth: Payer: Self-pay

## 2020-01-02 DIAGNOSIS — G7111 Myotonic muscular dystrophy: Secondary | ICD-10-CM

## 2020-01-02 DIAGNOSIS — R29898 Other symptoms and signs involving the musculoskeletal system: Secondary | ICD-10-CM

## 2020-01-02 DIAGNOSIS — Z820 Family history of epilepsy and other diseases of the nervous system: Secondary | ICD-10-CM

## 2020-01-02 NOTE — Telephone Encounter (Signed)
Please see result note of EMG

## 2020-01-02 NOTE — Telephone Encounter (Signed)
Dr. Jaffe patient 

## 2020-01-02 NOTE — Telephone Encounter (Signed)
Timothy Barrera - Patient called in to get his results from his EMG yesterday.

## 2020-01-03 NOTE — Telephone Encounter (Signed)
Timothy Barrera- Patient returning a call to get his EMG results

## 2020-01-03 NOTE — Telephone Encounter (Signed)
Patient returned a call to the office he thinks it is about his results

## 2020-01-05 ENCOUNTER — Other Ambulatory Visit: Payer: Self-pay

## 2020-01-05 ENCOUNTER — Emergency Department (HOSPITAL_COMMUNITY): Admission: EM | Admit: 2020-01-05 | Discharge: 2020-01-05 | Discharge status: Against Medical Advice | Payer: Self-pay

## 2020-01-05 ENCOUNTER — Encounter (HOSPITAL_COMMUNITY): Payer: Self-pay | Admitting: *Deleted

## 2020-01-05 NOTE — ED Triage Notes (Signed)
Pt tripped over a box that he states was in the floor in a store.  Has a swelling noted to back of head, also wants left knee looked at as well.  Pt denies any LOC or N/V.

## 2020-01-08 ENCOUNTER — Other Ambulatory Visit: Payer: Self-pay

## 2020-01-08 ENCOUNTER — Other Ambulatory Visit (INDEPENDENT_AMBULATORY_CARE_PROVIDER_SITE_OTHER): Payer: Self-pay

## 2020-01-08 DIAGNOSIS — G7111 Myotonic muscular dystrophy: Secondary | ICD-10-CM

## 2020-01-08 DIAGNOSIS — Z820 Family history of epilepsy and other diseases of the nervous system: Secondary | ICD-10-CM

## 2020-01-08 DIAGNOSIS — R29898 Other symptoms and signs involving the musculoskeletal system: Secondary | ICD-10-CM

## 2020-01-08 LAB — VITAMIN B12: Vitamin B-12: 272 pg/mL (ref 211–911)

## 2020-01-13 LAB — ANA: Anti Nuclear Antibody (ANA): NEGATIVE

## 2020-01-13 LAB — SJOGREN'S SYNDROME ANTIBODS(SSA + SSB)
SSA (Ro) (ENA) Antibody, IgG: <1 AI
SSB (La) (ENA) Antibody, IgG: <1 AI

## 2020-01-13 LAB — VITAMIN B6: Vitamin B6: 3.9 ng/mL (ref 2.1–21.7)

## 2020-01-13 LAB — ANGIOTENSIN CONVERTING ENZYME: Angiotensin-Converting Enzyme: 18 U/L (ref 9–67)

## 2020-01-14 ENCOUNTER — Ambulatory Visit: Payer: Self-pay | Admitting: Physician Assistant

## 2020-01-14 ENCOUNTER — Other Ambulatory Visit: Payer: Self-pay

## 2020-01-14 ENCOUNTER — Encounter: Payer: Self-pay | Admitting: Physician Assistant

## 2020-01-14 VITALS — BP 120/70 | HR 81 | Temp 97.5°F | Ht 73.0 in | Wt 236.4 lb

## 2020-01-14 DIAGNOSIS — M25562 Pain in left knee: Secondary | ICD-10-CM

## 2020-01-14 DIAGNOSIS — R479 Unspecified speech disturbances: Secondary | ICD-10-CM

## 2020-01-14 DIAGNOSIS — R531 Weakness: Secondary | ICD-10-CM

## 2020-01-14 DIAGNOSIS — F17200 Nicotine dependence, unspecified, uncomplicated: Secondary | ICD-10-CM

## 2020-01-14 NOTE — Progress Notes (Signed)
BP 120/70   Pulse 81   Temp (!) 97.5 F (36.4 C)   Ht 6\' 1"  (1.854 m)   Wt 236 lb 6.4 oz (107.2 kg)   SpO2 97%   BMI 31.19 kg/m    Subjective:    Patient ID: Timothy Barrera, male    DOB: 02-Nov-1985, 34 y.o.   MRN: [id number]  HPI: Timothy Barrera is a 34 y.o. male presenting on 01/14/2020 for Follow-up   HPI  Pt had a negative covid 19 screening questionnaire.   Pt is 34yoM here for routine follow up.  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  Pt had additional blood labs ordered and he says he will be following up with neurology after his labs are all finished.   Pt says he never turned in the application for [employer] financial assistance.  He doesn't have a reason.  He says he has been making payments but does ask for another application form.  Pt says his L knee is hurting.  He says it has been hurting since he fell, maybe in July.  He says it isn't getting better.    Pt got his first dose of covid vaccination and has appointment for later this month for his second dose     Relevant past medical, surgical, family and social history reviewed and updated as indicated. Interim medical history since our last visit reviewed. Allergies and medications reviewed and updated.  Review of Systems  Per HPI unless specifically indicated above     Objective:    BP 120/70   Pulse 81   Temp (!) 97.5 F (36.4 C)   Ht 6\' 1"  (1.854 m)   Wt 236 lb 6.4 oz (107.2 kg)   SpO2 97%   BMI 31.19 kg/m   Wt Readings from Last 3 Encounters:  01/14/20 236 lb 6.4 oz (107.2 kg)  01/05/20 240 lb (108.9 kg)  11/19/19 240 lb (108.9 kg)    Physical Exam Constitutional:      General: He is not in acute distress.    Appearance: He is obese.     Comments: Speech impediment  HENT:     Head: Normocephalic and atraumatic.  Cardiovascular:     Rate and Rhythm: Normal rate and regular rhythm.  Pulmonary:      Effort: Pulmonary effort is normal. No respiratory distress.  Musculoskeletal:     Left knee: Normal range of motion. Tenderness present. Normal pulse.     Right lower leg: No edema.     Left lower leg: No swelling. No edema.     Comments: L knee mild non-point tenderness.  No decrease ROM but ROM increases pain.  Patella does not feel flat but same on R knee as well.  No instability.  Mild swelling inferior to the patella.  No bruising.  Neurological:     Mental Status: He is alert and oriented to person, place, and time.     Comments: Pt is ambulating with assistance of a cane  Psychiatric:        Attention and Perception: Attention normal.        Behavior: Behavior normal. Behavior is cooperative.           Assessment & Plan:    Encounter Diagnoses  Name Primary?  . Left knee pain, unspecified chronicity Yes  . Weakness   . Tobacco use disorder   . Speech impediment      -  will get xray of the left knee.  Pt will need referral to orthopedics for further evaluation/treatment.  Will enter after review of xray -pt is given another application for [employer] financial assistance -pt is encouraged to get his second covid vaccination as scheduled -pt is to follow up with neurology after his testing is completed.   -counseled smoking cessation -pt to follow up here 6 months.  He is to contact office sooner prn

## 2020-01-14 NOTE — Patient Instructions (Signed)
Coping with Quitting Smoking  Quitting smoking is a physical and mental challenge. You will face cravings, withdrawal symptoms, and temptation. Before quitting, work with your health care provider to make a plan that can help you cope. Preparation can help you quit and keep you from giving in. How can I cope with cravings? Cravings usually last for 5-10 minutes. If you get through it, the craving will pass. Consider taking the following actions to help you cope with cravings:  Keep your mouth busy: ? Chew sugar-free gum. ? Suck on hard candies or a straw. ? Brush your teeth.  Keep your hands and body busy: ? Immediately change to a different activity when you feel a craving. ? Squeeze or play with a ball. ? Do an activity or a hobby, like making bead jewelry, practicing needlepoint, or working with wood. ? Mix up your normal routine. ? Take a short exercise break. Go for a quick walk or run up and down stairs. ? Spend time in public places where smoking is not allowed.  Focus on doing something kind or helpful for someone else.  Call a friend or family member to talk during a craving.  Join a support group.  Call a quit line, such as 1-800-QUIT-NOW.  Talk with your health care provider about medicines that might help you cope with cravings and make quitting easier for you. How can I deal with withdrawal symptoms? Your body may experience negative effects as it tries to get used to not having nicotine in the system. These effects are called withdrawal symptoms. They may include:  Feeling hungrier than normal.  Trouble concentrating.  Irritability.  Trouble sleeping.  Feeling depressed.  Restlessness and agitation.  Craving a cigarette. To manage withdrawal symptoms:  Avoid places, people, and activities that trigger your cravings.  Remember why you want to quit.  Get plenty of sleep.  Avoid coffee and other caffeinated drinks. These may worsen some of your  symptoms. How can I handle social situations? Social situations can be difficult when you are quitting smoking, especially in the first few weeks. To manage this, you can:  Avoid parties, bars, and other social situations where people might be smoking.  Avoid alcohol.  Leave right away if you have the urge to smoke.  Explain to your family and friends that you are quitting smoking. Ask for understanding and support.  Plan activities with friends or family where smoking is not an option. What are some ways I can cope with stress? Wanting to smoke may cause stress, and stress can make you want to smoke. Find ways to manage your stress. Relaxation techniques can help. For example:  Breathe slowly and deeply, in through your nose and out through your mouth.  Listen to soothing, relaxing music.  Talk with a family member or friend about your stress.  Light a candle.  Soak in a bath or take a shower.  Think about a peaceful place. What are some ways I can prevent weight gain? Be aware that many people gain weight after they quit smoking. However, not everyone does. To keep from gaining weight, have a plan in place before you quit and stick to the plan after you quit. Your plan should include:  Having healthy snacks. When you have a craving, it may help to: ? Eat plain popcorn, crunchy carrots, celery, or other cut vegetables. ? Chew sugar-free gum.  Changing how you eat: ? Eat small portion sizes at meals. ? Eat 4-6 small meals   throughout the day instead of 1-2 large meals a day. ? Be mindful when you eat. Do not watch television or do other things that might distract you as you eat.  Exercising regularly: ? Make time to exercise each day. If you do not have time for a long workout, do short bouts of exercise for 5-10 minutes several times a day. ? Do some form of strengthening exercise, like weight lifting, and some form of aerobic exercise, like running or swimming.  Drinking  plenty of water or other low-calorie or no-calorie drinks. Drink 6-8 glasses of water daily, or as much as instructed by your health care provider. Summary  Quitting smoking is a physical and mental challenge. You will face cravings, withdrawal symptoms, and temptation to smoke again. Preparation can help you as you go through these challenges.  You can cope with cravings by keeping your mouth busy (such as by chewing gum), keeping your body and hands busy, and making calls to family, friends, or a helpline for people who want to quit smoking.  You can cope with withdrawal symptoms by avoiding places where people smoke, avoiding drinks with caffeine, and getting plenty of rest.  Ask your health care provider about the different ways to prevent weight gain, avoid stress, and handle social situations. This information is not intended to replace advice given to you by your health care provider. Make sure you discuss any questions you have with your health care provider. Document Revised: 03/04/2017 Document Reviewed: 03/19/2016 Elsevier Patient Education  2020 [employer] Inc[employer]  

## 2020-02-11 ENCOUNTER — Telehealth: Payer: Self-pay | Admitting: Neurology

## 2020-02-11 NOTE — Telephone Encounter (Signed)
Pls let him know bloodwork was normal. His B12 level was low normal, 272. It would not cause his symptoms. When it is low normal, would start a daily vitamin B12 500 mcg supplement. Thanks

## 2020-02-11 NOTE — Telephone Encounter (Signed)
Called patient and left a message for a call back.  

## 2020-02-11 NOTE — Telephone Encounter (Signed)
Patient states that Dr. Allena Katz ordered some blood work and has not heard from anyone with the results please call  Patient

## 2020-02-12 NOTE — Telephone Encounter (Signed)
Called patient and informed him of results and recommendations. Patient verbalized understanding.

## 2020-02-12 NOTE — Telephone Encounter (Signed)
Patient called in returning Timothy Barrera's call. 

## 2020-02-22 ENCOUNTER — Telehealth: Payer: Self-pay | Admitting: Neurology

## 2020-02-22 NOTE — Telephone Encounter (Signed)
Patient called in with a question about his vitamin supplements.

## 2020-02-25 NOTE — Telephone Encounter (Signed)
Called patient and left a message for a call back.  

## 2020-02-26 NOTE — Telephone Encounter (Signed)
Patient left message with after hours stating he was returning a call to the office.

## 2020-02-26 NOTE — Telephone Encounter (Signed)
Called patient and advised to start B12 supplement per previous note from Dr. Karel Jarvis. Informed patient that he can get B12 over the counter. Patient stated he will purchase once he get's some money.

## 2020-03-16 ENCOUNTER — Ambulatory Visit
Admission: EM | Admit: 2020-03-16 | Discharge: 2020-03-16 | Disposition: A | Discharge status: Home / Self Care | Payer: Self-pay | Attending: Family Medicine | Admitting: Family Medicine

## 2020-03-16 ENCOUNTER — Other Ambulatory Visit: Payer: Self-pay

## 2020-03-16 DIAGNOSIS — M79601 Pain in right arm: Secondary | ICD-10-CM

## 2020-03-16 MED ORDER — NAPROXEN 125 MG/5ML PO SUSP: 500.0000 mg | Freq: Two times a day (BID) | ORAL | 0 refills | Status: AC

## 2020-03-16 NOTE — ED Triage Notes (Signed)
Pt presents with right arm pain that began 3 weeks ago, pain is from wrist to shoulder, denies injury , pt is poor historian

## 2020-03-16 NOTE — ED Provider Notes (Signed)
[facility]    CSN: [id number] Arrival date & time: 03/16/20  1204      History   Chief Complaint Chief Complaint  Patient presents with  . Arm Pain    HPI Timothy Barrera is a 34 y.o. male.   HPI  Patient presents with right upper extremity pain. Reports pain has been present for several weeks. He has not attempted relief with any otc medication. He also reports he unaware of reason he is here that his family told him to come to be seen. He is limited historian. In review of EMR, patient is followed by neurology and primary care provider.  Past Medical History:  Diagnosis Date  . Speech impediment     There are no problems to display for this patient.   Past Surgical History:  Procedure Laterality Date  . HERNIA REPAIR         Home Medications    Prior to Admission medications   Not on File    Family History No family history on file.  Social History Social History   Tobacco Use  . Smoking status: Current Every Day Smoker    Packs/day: 0.50    Types: Cigarettes  . Smokeless tobacco: Never Used  Vaping Use  . Vaping Use: Never used  Substance Use Topics  . Alcohol use: Yes    Comment: socially  . Drug use: No     Allergies   Patient has no known allergies.   Review of Systems Review of Systems Pertinent negatives listed in HPI  Physical Exam Triage Vital Signs ED Triage Vitals  Enc Vitals Group     BP 03/16/20 1236 117/78     Pulse Rate 03/16/20 1236 71     Resp 03/16/20 1236 18     Temp 03/16/20 1236 99.1 F (37.3 C)     Temp src --      SpO2 03/16/20 1236 94 %     Weight --      Height --      Head Circumference --      Peak Flow --      Pain Score 03/16/20 1235 5     Pain Loc --      Pain Edu? --      Excl. in GC? --    No data found.  Updated Vital Signs BP 117/78   Pulse 71   Temp 99.1 F (37.3 C)   Resp 18   SpO2 94%   Visual Acuity Right Eye Distance:   Left Eye Distance:   Bilateral  Distance:    Right Eye Near:   Left Eye Near:    Bilateral Near:     Physical Exam Constitutional:      Comments: Chronically-ill appearing.  Cardiovascular:     Rate and Rhythm: Normal rate and regular rhythm.  Pulmonary:     Effort: Pulmonary effort is normal.     Breath sounds: Normal breath sounds.  Musculoskeletal:     Right upper arm: Normal. No swelling, tenderness or bony tenderness.     Right elbow: Normal.     Right forearm: Normal. No tenderness or bony tenderness.     Right wrist: Normal.     Right hand: Normal.  Neurological:     Mental Status: He is alert.      UC Treatments / Results  Labs (all labs ordered are listed, but only abnormal results are displayed) Labs Reviewed - No data to display  EKG  Radiology No results found.  Procedures Procedures (including critical care time)  Medications Ordered in UC Medications - No data to display  Initial Impression / Assessment and Plan / UC Course  I have reviewed the triage vital signs and the nursing notes.  Pertinent labs & imaging results that were available during my care of the patient were reviewed by me and considered in my medical decision making (see chart for details).     Right upper extremity pain, non specific. Limited historian. Agreed trial Naprosyn (patient requested liquid suspension due to impaired swallowing.) Follow-up with PCP as needed.  Final Clinical Impressions(s) / UC Diagnoses   Final diagnoses:  Pain of right upper extremity   Discharge Instructions   None    ED Prescriptions    Medication Sig Dispense Auth. Provider   naproxen (NAPROSYN) 125 MG/5ML suspension Take 20 mLs (500 mg total) by mouth 2 (two) times daily with a meal for 7 days. 280 mL Bing Neighbors, FNP     PDMP not reviewed this encounter.   Bing Neighbors, FNP 03/26/20 1925

## 2020-03-31 ENCOUNTER — Encounter: Payer: Self-pay | Admitting: Physician Assistant

## 2020-03-31 ENCOUNTER — Ambulatory Visit: Payer: Self-pay | Admitting: Physician Assistant

## 2020-03-31 ENCOUNTER — Other Ambulatory Visit: Payer: Self-pay

## 2020-03-31 ENCOUNTER — Ambulatory Visit (HOSPITAL_COMMUNITY)
Admission: RE | Admit: 2020-03-31 | Discharge: 2020-03-31 | Disposition: A | Discharge status: Home / Self Care | Payer: Self-pay | Source: Ambulatory Visit | Attending: Physician Assistant | Admitting: Physician Assistant

## 2020-03-31 VITALS — BP 110/78 | HR 74 | Temp 97.0°F | Ht 73.0 in | Wt 236.7 lb

## 2020-03-31 DIAGNOSIS — M25511 Pain in right shoulder: Secondary | ICD-10-CM | POA: Insufficient documentation

## 2020-03-31 MED ORDER — GABAPENTIN 100 MG PO CAPS
100.0000 mg | ORAL_CAPSULE | Freq: Three times a day (TID) | ORAL | 0 refills | Status: DC | PRN
Start: 2020-03-31 — End: 2021-03-03

## 2020-03-31 MED ORDER — PREDNISONE 20 MG PO TABS
20.0000 mg | ORAL_TABLET | Freq: Two times a day (BID) | ORAL | 0 refills | Status: DC
Start: 2020-03-31 — End: 2021-03-03

## 2020-03-31 NOTE — Progress Notes (Signed)
   BP 110/78   Pulse 74   Temp (!) 97 F (36.1 C)   Ht 6\' 1"  (1.854 m)   Wt 236 lb 11.2 oz (107.4 kg)   SpO2 96%   BMI 31.23 kg/m    Subjective:    Patient ID: Timothy Barrera, male    DOB: 06/22/1985, 34 y.o.   MRN: [id number]  HPI: Timothy Barrera is a 34 y.o. male presenting on 03/31/2020 for Arm Pain (Pt states R arm started hurting about one month ago. Pt denies injuries and states he woke up with his arm hurting. Pt has not taken any OTC medications for pain relief. Pt states painful to move and also when not hurting. )   HPI   Pt had a negative covid 19 screening questionnaire.   Chief Complaint  Patient presents with  . Arm Pain    Pt states R arm started hurting about one month ago. Pt denies injuries and states he woke up with his arm hurting. Pt has not taken any OTC medications for pain relief. Pt states painful to move and also when not hurting.      Pt reports Acute onset RUE pain.  He says he awakened one day and it was hurting.  It is always there, it never goes away.    He tried IBU and APAP and says it didn't help.  He Works at 04/02/2020.  He does not move patients/clients.    Relevant past medical, surgical, family and social history reviewed and updated as indicated. Interim medical history since our last visit reviewed. Allergies and medications reviewed and updated.  No current outpatient medications on file.     Review of Systems  Per HPI unless specifically indicated above     Objective:    BP 110/78   Pulse 74   Temp (!) 97 F (36.1 C)   Ht 6\' 1"  (1.854 m)   Wt 236 lb 11.2 oz (107.4 kg)   SpO2 96%   BMI 31.23 kg/m   Wt Readings from Last 3 Encounters:  03/31/20 236 lb 11.2 oz (107.4 kg)  01/14/20 236 lb 6.4 oz (107.2 kg)  01/05/20 240 lb (108.9 kg)    Physical Exam Constitutional:      General: He is not in acute distress.    Appearance: He is not ill-appearing.  Pulmonary:     Effort: Pulmonary effort is normal.  No respiratory distress.  Musculoskeletal:     Right shoulder: Tenderness present. No swelling, deformity or bony tenderness. Decreased range of motion.     Right upper arm: Normal.     Right elbow: Normal.     Comments: Abduction RUE limited to about 100 degrees  Other ROM intact but does increase pain.   Neurological:     Mental Status: He is alert and oriented to person, place, and time.  Psychiatric:        Behavior: Behavior normal.           Assessment & Plan:   Encounter Diagnosis  Name Primary?  . Right shoulder pain, unspecified chronicity Yes      -Prednisone course and xray. -Pt requests pain med.  rx gabapentin. -pt is given CAFA application and encouraged to get it submitted to cover cost of xray -he has follow up scheduled and is to contact office sooner prn

## 2020-05-05 ENCOUNTER — Encounter: Payer: Self-pay | Admitting: Physician Assistant

## 2020-05-05 ENCOUNTER — Ambulatory Visit: Payer: Self-pay | Admitting: Physician Assistant

## 2020-05-05 ENCOUNTER — Telehealth: Payer: Self-pay | Admitting: Neurology

## 2020-05-05 DIAGNOSIS — R509 Fever, unspecified: Secondary | ICD-10-CM

## 2020-05-05 DIAGNOSIS — Z20822 Contact with and (suspected) exposure to covid-19: Secondary | ICD-10-CM

## 2020-05-05 DIAGNOSIS — J029 Acute pharyngitis, unspecified: Secondary | ICD-10-CM

## 2020-05-05 NOTE — Telephone Encounter (Signed)
Do you have any results for him?

## 2020-05-05 NOTE — Progress Notes (Signed)
   There were no vitals taken for this visit.   Subjective:    Patient ID: Timothy Barrera, male    DOB: January 17, 1986, 35 y.o.   MRN: [id number]  HPI: Jalal Rauch is a 35 y.o. male presenting on 05/05/2020 for No chief complaint on file.   HPI    This is a telemedicine appointment through [employer] due to coronavirus pandemic.  I connected with  [name] on 05/05/20 by a video enabled telemedicine application and verified that I am speaking with the correct person using two identifiers.   I discussed the limitations of evaluation and management by telemedicine. The patient expressed understanding and agreed to proceed.  Pt is at home.  Provider is at office.    Pt started with Fever and ST that began on Saturday.    He Got both covid vaccination shots  He Works as a Engineer, structural    Relevant past medical, surgical, family and social history reviewed and updated as indicated. Interim medical history since our last visit reviewed. Allergies and medications reviewed and updated.  CURRENT MEDS: gabapentin  Review of Systems  Per HPI unless specifically indicated above     Objective:    There were no vitals taken for this visit.  Wt Readings from Last 3 Encounters:  03/31/20 236 lb 11.2 oz (107.4 kg)  01/14/20 236 lb 6.4 oz (107.2 kg)  01/05/20 240 lb (108.9 kg)    Physical Exam Constitutional:      General: He is not in acute distress.    Appearance: He is not toxic-appearing.  Pulmonary:     Effort: No respiratory distress.     Comments: Pt is speaking in complete sentences without dyspnea. Neurological:     Mental Status: He is alert and oriented to person, place, and time.  Psychiatric:        Attention and Perception: Attention normal.        Behavior: Behavior is cooperative.     Comments: Pt speech at baseline; he has a speech impediment.              Assessment & Plan:    Encounter Diagnoses  Name Primary?  . Person under  investigation for COVID-19 Yes  . Fever, unspecified fever cause   . Sore throat       -discussed with pt that he needs to get covid tested.  Discussed options and he will go to [facility] today.   -he is to rest, fluids, OTCs prn -counseled pt to self-isolate -pt is emailed note for work -pt to contact office if any questions

## 2020-05-05 NOTE — Telephone Encounter (Signed)
Patient called in and left a message wanting to get his results.

## 2020-05-05 NOTE — Telephone Encounter (Signed)
Patient called and asked for his results from a nurse. Patient did not specify which results.

## 2020-05-05 NOTE — Telephone Encounter (Signed)
Patient has not came in for labs here. I advised to come in for labs, had Sheena review with me. He is going to have it done and follow up with jaffe after

## 2020-05-12 ENCOUNTER — Other Ambulatory Visit: Payer: Self-pay

## 2020-06-05 ENCOUNTER — Other Ambulatory Visit: Payer: Self-pay

## 2020-06-12 ENCOUNTER — Other Ambulatory Visit: Payer: Self-pay

## 2020-06-12 ENCOUNTER — Other Ambulatory Visit: Payer: 59

## 2020-07-02 ENCOUNTER — Other Ambulatory Visit: Payer: Self-pay | Admitting: Physician Assistant

## 2020-07-14 ENCOUNTER — Ambulatory Visit: Payer: Self-pay | Admitting: Physician Assistant

## 2020-07-24 ENCOUNTER — Encounter: Payer: Self-pay | Admitting: Physician Assistant

## 2020-08-04 ENCOUNTER — Other Ambulatory Visit: Payer: Self-pay

## 2020-08-04 ENCOUNTER — Ambulatory Visit
Admission: EM | Admit: 2020-08-04 | Discharge: 2020-08-04 | Disposition: A | Discharge status: Home / Self Care | Payer: 59 | Attending: Family Medicine | Admitting: Family Medicine

## 2020-08-04 DIAGNOSIS — L03115 Cellulitis of right lower limb: Secondary | ICD-10-CM | POA: Diagnosis not present

## 2020-08-04 DIAGNOSIS — L309 Dermatitis, unspecified: Secondary | ICD-10-CM | POA: Diagnosis not present

## 2020-08-04 MED ORDER — MUPIROCIN 2 % EX OINT
1.0000 | TOPICAL_OINTMENT | Freq: Two times a day (BID) | CUTANEOUS | 0 refills | Status: DC
Start: 2020-08-04 — End: 2021-01-14

## 2020-08-04 MED ORDER — DOXYCYCLINE MONOHYDRATE 25 MG/5ML PO SUSR: 100.0000 mg | Freq: Two times a day (BID) | ORAL | 0 refills | Status: DC

## 2020-08-04 MED ORDER — NYSTATIN-TRIAMCINOLONE 100000-0.1 UNIT/GM-% EX OINT
1.0000 | TOPICAL_OINTMENT | Freq: Two times a day (BID) | CUTANEOUS | 0 refills | Status: DC
Start: 2020-08-04 — End: 2021-03-03

## 2020-08-04 MED ORDER — CEFDINIR 250 MG/5ML PO SUSR: 300.0000 mg | Freq: Two times a day (BID) | ORAL | 0 refills | Status: AC

## 2020-08-04 MED ORDER — CEFTRIAXONE SODIUM 1 G IJ SOLR
1.0000 g | Freq: Once | INTRAMUSCULAR | Status: AC
  Administered 2020-08-04: 1 g via INTRAMUSCULAR

## 2020-08-04 NOTE — ED Triage Notes (Signed)
Tetanus is up to date.

## 2020-08-04 NOTE — ED Provider Notes (Incomplete Revision)
[facility]    CSN: [id number] Arrival date & time: 08/04/20  1330      History   Chief Complaint Chief Complaint  Patient presents with  . Wound Check    HPI Timothy Barrera is a 35 y.o. male.   HPI  Right leg infection and swelling after a wooden broad fell and puncture leg x 4 days ago. The wound has become increasingly red and painful. No drainage from the wound. Last TDAP 2019   Past Medical History:  Diagnosis Date  . Speech impediment     There are no problems to display for this patient.   Past Surgical History:  Procedure Laterality Date  . HERNIA REPAIR         Home Medications    Prior to Admission medications   Medication Sig Start Date End Date Taking? Authorizing Provider  gabapentin (NEURONTIN) 100 MG capsule Take 1 capsule (100 mg total) by mouth every 8 (eight) hours as needed. 03/31/20   Jacquelin Hawking, PA-C  predniSONE (DELTASONE) 20 MG tablet Take 1 tablet (20 mg total) by mouth 2 (two) times daily with a meal. Patient not taking: Reported on 05/05/2020 03/31/20   Jacquelin Hawking, PA-C    Family History History reviewed. No pertinent family history.  Social History Social History   Tobacco Use  . Smoking status: Current Every Day Smoker    Packs/day: 1.00    Types: Cigarettes  . Smokeless tobacco: Never Used  Vaping Use  . Vaping Use: Never used  Substance Use Topics  . Alcohol use: Yes    Comment: socially  . Drug use: No     Allergies   Patient has no known allergies.   Review of Systems Review of Systems Pertinent negatives listed in HPI  Physical Exam Triage Vital Signs ED Triage Vitals [08/04/20 1516]  Enc Vitals Group     BP (!) 142/87     Pulse Rate 67     Resp 18     Temp (!) 97.2 F (36.2 C)     Temp src      SpO2 94 %     Weight      Height      Head Circumference      Peak Flow      Pain Score      Pain Loc      Pain Edu?      Excl. in GC?    No data found.  Updated Vital  Signs BP (!) 142/87   Pulse 67   Temp (!) 97.2 F (36.2 C)   Resp 18   SpO2 94%   Visual Acuity Right Eye Distance:   Left Eye Distance:   Bilateral Distance:    Right Eye Near:   Left Eye Near:    Bilateral Near:     Physical Exam   UC Treatments / Results  Labs (all labs ordered are listed, but only abnormal results are displayed) Labs Reviewed - No data to display  EKG   Radiology No results found.  Procedures Procedures (including critical care time)  Medications Ordered in UC Medications - No data to display  Initial Impression / Assessment and Plan / UC Course  I have reviewed the triage vital signs and the nursing notes.  Pertinent labs & imaging results that were available during my care of the patient were reviewed by me and considered in my medical decision making (see chart for details).     Right leg  cellulitis, TDAP up-to-date 6/19 per EMR Patient is unable to take pills, doxycyline initially prescribed, unavailable at pharmacy. Prescribed Ominicef 300 mg twice daily x 10 days. Bacteriocin applied directly to wound twice daily with dressing change.  Strict ER precaution if symptoms worsen. For bilateral foot dermatitis , triamcinolone-nystatin cream applied to feet twice daily. Final Clinical Impressions(s) / UC Diagnoses   Final diagnoses:  Cellulitis of leg, right  Foot dermatitis     Discharge Instructions     Change leg dressing twice daily and apply mupirocin ointment.  Complete entire course of antibiotics.  For your foot rash I have prescribed you nystatin triamcinolone ointment apply to feet twice daily directly to rash.  If infection of the legs worsens go immediately to the emergency department.    ED Prescriptions    Medication Sig Dispense Auth. Provider   doxycycline (VIBRAMYCIN) 25 MG/5ML SUSR  (Status: Discontinued) Take 20 mLs (100 mg total) by mouth 2 (two) times daily for 10 days. 400 mL Bing Neighbors, FNP    doxycycline (VIBRAMYCIN) 25 MG/5ML SUSR  (Status: Discontinued) Take 20 mLs (100 mg total) by mouth 2 (two) times daily for 10 days. 400 mL Bing Neighbors, FNP   nystatin-triamcinolone ointment [facility]) Apply 1 application topically 2 (two) times daily. Apply to foot rash. 190 g Bing Neighbors, FNP   mupirocin ointment (BACTROBAN) 2 % Apply 1 application topically 2 (two) times daily. Apply directly to leg wound with each dressing change. 90 g Bing Neighbors, FNP   cefdinir (OMNICEF) 250 MG/5ML suspension Take 6 mLs (300 mg total) by mouth 2 (two) times daily for 10 days. 120 mL Bing Neighbors, FNP     PDMP not reviewed this encounter.   Bing Neighbors, FNP 08/04/20 [phone]

## 2020-08-04 NOTE — Discharge Instructions (Addendum)
Change leg dressing twice daily and apply mupirocin ointment.  Complete entire course of antibiotics.  For your foot rash I have prescribed you nystatin triamcinolone ointment apply to feet twice daily directly to rash.  If infection of the legs worsens go immediately to the emergency department.

## 2020-08-04 NOTE — ED Triage Notes (Signed)
Pt presents with right lower leg wound that happened Friday , when board fell on leg , redness noted,

## 2020-08-04 NOTE — ED Provider Notes (Addendum)
[facility]    CSN: [id number] Arrival date & time: 08/04/20  1330      History   Chief Complaint Chief Complaint  Patient presents with  . Wound Check    HPI Timothy Barrera is a 35 y.o. male.   HPI  Right leg infection and swelling after a wooden broad fell and puncture leg x 4 days ago. The wound has become increasingly red and painful. No drainage from the wound. Last TDAP 2019.  Patient has history of recurrent cellulitis infections.  Past Medical History:  Diagnosis Date  . Speech impediment     There are no problems to display for this patient.   Past Surgical History:  Procedure Laterality Date  . HERNIA REPAIR         Home Medications    Prior to Admission medications   Medication Sig Start Date End Date Taking? Authorizing Provider  gabapentin (NEURONTIN) 100 MG capsule Take 1 capsule (100 mg total) by mouth every 8 (eight) hours as needed. 03/31/20   Jacquelin Hawking, PA-C  predniSONE (DELTASONE) 20 MG tablet Take 1 tablet (20 mg total) by mouth 2 (two) times daily with a meal. Patient not taking: Reported on 05/05/2020 03/31/20   Jacquelin Hawking, PA-C    Family History History reviewed. No pertinent family history.  Social History Social History   Tobacco Use  . Smoking status: Current Every Day Smoker    Packs/day: 1.00    Types: Cigarettes  . Smokeless tobacco: Never Used  Vaping Use  . Vaping Use: Never used  Substance Use Topics  . Alcohol use: Yes    Comment: socially  . Drug use: No     Allergies   Patient has no known allergies.   Review of Systems Review of Systems Pertinent negatives listed in HPI  Physical Exam Triage Vital Signs ED Triage Vitals [08/04/20 1516]  Enc Vitals Group     BP (!) 142/87     Pulse Rate 67     Resp 18     Temp (!) 97.2 F (36.2 C)     Temp src      SpO2 94 %     Weight      Height      Head Circumference      Peak Flow      Pain Score      Pain Loc      Pain Edu?       Excl. in GC?    No data found.  Updated Vital Signs BP (!) 142/87   Pulse 67   Temp (!) 97.2 F (36.2 C)   Resp 18   SpO2 94%   Visual Acuity Right Eye Distance:   Left Eye Distance:   Bilateral Distance:    Right Eye Near:   Left Eye Near:    Bilateral Near:     Physical Exam General appearance: alert, well developed, well nourished, cooperative and in no distress Head: Normocephalic, without obvious abnormality, atraumatic Respiratory: Respirations even and unlabored, normal respiratory rate Heart: rate and rhythm normal. No gallop or murmurs noted on exam  Abdomen: BS +, no distention, no rebound tenderness, or no mass Extremities: Right lower leg wound with purulent drainage, swelling and erythema extending away from wound  Skin: Skin color, texture, turgor normal. No rashes seen  Psych: Appropriate mood and affect. Neurologic: Baseline neurological function  UC Treatments / Results  Labs (all labs ordered are listed, but only abnormal results are displayed)  Labs Reviewed - No data to display  EKG   Radiology No results found.  Procedures Procedures (including critical care time)  Medications Ordered in UC Medications - No data to display  Initial Impression / Assessment and Plan / UC Course  I have reviewed the triage vital signs and the nursing notes.  Pertinent labs & imaging results that were available during my care of the patient were reviewed by me and considered in my medical decision making (see chart for details).     Right leg cellulitis, TDAP up-to-date 6/19 per EMR Patient is unable to take pills, doxycyline initially prescribed, unavailable at pharmacy. Prescribed Ominicef 300 mg twice daily x 10 days. Bacteriocin applied directly to wound twice daily with dressing change.  Strict ER precaution if symptoms worsen. For bilateral foot dermatitis , triamcinolone-nystatin cream applied to feet twice daily. Final Clinical Impressions(s) / UC  Diagnoses   Final diagnoses:  Cellulitis of leg, right  Foot dermatitis     Discharge Instructions     Change leg dressing twice daily and apply mupirocin ointment.  Complete entire course of antibiotics.  For your foot rash I have prescribed you nystatin triamcinolone ointment apply to feet twice daily directly to rash.  If infection of the legs worsens go immediately to the emergency department.    ED Prescriptions    Medication Sig Dispense Auth. Provider   doxycycline (VIBRAMYCIN) 25 MG/5ML SUSR  (Status: Discontinued) Take 20 mLs (100 mg total) by mouth 2 (two) times daily for 10 days. 400 mL Bing Neighbors, FNP   doxycycline (VIBRAMYCIN) 25 MG/5ML SUSR  (Status: Discontinued) Take 20 mLs (100 mg total) by mouth 2 (two) times daily for 10 days. 400 mL Bing Neighbors, FNP   nystatin-triamcinolone ointment [facility]) Apply 1 application topically 2 (two) times daily. Apply to foot rash. 190 g Bing Neighbors, FNP   mupirocin ointment (BACTROBAN) 2 % Apply 1 application topically 2 (two) times daily. Apply directly to leg wound with each dressing change. 90 g Bing Neighbors, FNP   cefdinir (OMNICEF) 250 MG/5ML suspension Take 6 mLs (300 mg total) by mouth 2 (two) times daily for 10 days. 120 mL Bing Neighbors, FNP     PDMP not reviewed this encounter.   Bing Neighbors, FNP 08/04/20 1713    Bing Neighbors, FNP 08/10/20 1043

## 2020-08-20 ENCOUNTER — Ambulatory Visit
Admission: EM | Admit: 2020-08-20 | Discharge: 2020-08-20 | Disposition: A | Discharge status: Home / Self Care | Payer: 59 | Attending: Family Medicine | Admitting: Family Medicine

## 2020-08-20 ENCOUNTER — Encounter: Payer: Self-pay | Admitting: Emergency Medicine

## 2020-08-20 ENCOUNTER — Other Ambulatory Visit: Payer: Self-pay

## 2020-08-20 DIAGNOSIS — L03115 Cellulitis of right lower limb: Secondary | ICD-10-CM

## 2020-08-20 MED ORDER — SULFAMETHOXAZOLE-TRIMETHOPRIM 200-40 MG/5ML PO SUSP: 20.0000 mL | Freq: Two times a day (BID) | ORAL | 0 refills | Status: AC

## 2020-08-20 MED ORDER — SULFAMETHOXAZOLE-TRIMETHOPRIM 800-160 MG PO TABS: 1.0000 | ORAL_TABLET | Freq: Two times a day (BID) | ORAL | 0 refills | Status: DC

## 2020-08-20 NOTE — ED Triage Notes (Signed)
Wound to RT lower leg

## 2020-08-20 NOTE — Discharge Instructions (Addendum)
I have sent in Bactrim for you to take twice a day for 10 days.  Change the dressing at least twice a day.  Follow up with this office or with primary care if symptoms are persisting.  Follow up in the ER for high fever, trouble swallowing, trouble breathing, other concerning symptoms.

## 2020-11-13 ENCOUNTER — Emergency Department (HOSPITAL_COMMUNITY): Payer: 59

## 2020-11-13 ENCOUNTER — Encounter (HOSPITAL_COMMUNITY): Payer: Self-pay | Admitting: *Deleted

## 2020-11-13 ENCOUNTER — Emergency Department (HOSPITAL_COMMUNITY)
Admission: EM | Admit: 2020-11-13 | Discharge: 2020-11-13 | Disposition: A | Discharge status: Home / Self Care | Payer: 59 | Attending: Emergency Medicine | Admitting: Emergency Medicine

## 2020-11-13 DIAGNOSIS — W19XXXA Unspecified fall, initial encounter: Secondary | ICD-10-CM | POA: Diagnosis not present

## 2020-11-13 DIAGNOSIS — Z5321 Procedure and treatment not carried out due to patient leaving prior to being seen by health care provider: Secondary | ICD-10-CM | POA: Insufficient documentation

## 2020-11-13 DIAGNOSIS — M25571 Pain in right ankle and joints of right foot: Secondary | ICD-10-CM | POA: Diagnosis not present

## 2020-11-13 NOTE — ED Triage Notes (Signed)
Larey Seat this am, pain in right ankle

## 2020-11-13 NOTE — ED Notes (Addendum)
Pt returned from xray to room, pt asking how long to get results.  Pt states he can't wait any longer due to mother has to go to work.  Pt wheeled out to waiting room to call his ride.

## 2021-01-14 ENCOUNTER — Encounter: Payer: Self-pay | Admitting: Emergency Medicine

## 2021-01-14 ENCOUNTER — Ambulatory Visit
Admission: EM | Admit: 2021-01-14 | Discharge: 2021-01-14 | Disposition: A | Discharge status: Home / Self Care | Payer: No Typology Code available for payment source | Attending: Family Medicine | Admitting: Family Medicine

## 2021-01-14 ENCOUNTER — Other Ambulatory Visit: Payer: Self-pay

## 2021-01-14 DIAGNOSIS — S80212A Abrasion, left knee, initial encounter: Secondary | ICD-10-CM | POA: Diagnosis not present

## 2021-01-14 DIAGNOSIS — M25562 Pain in left knee: Secondary | ICD-10-CM | POA: Diagnosis not present

## 2021-01-14 DIAGNOSIS — W19XXXA Unspecified fall, initial encounter: Secondary | ICD-10-CM

## 2021-01-14 MED ORDER — HIBICLENS 4 % EX LIQD: Freq: Every day | CUTANEOUS | 0 refills | Status: DC | PRN

## 2021-01-14 MED ORDER — MELOXICAM 7.5 MG/5ML PO SUSP: 7.5000 mg | Freq: Two times a day (BID) | ORAL | 0 refills | Status: DC | PRN

## 2021-01-14 MED ORDER — MUPIROCIN 2 % EX OINT: 1.0000 "application " | TOPICAL_OINTMENT | Freq: Two times a day (BID) | CUTANEOUS | 0 refills | Status: DC

## 2021-01-14 NOTE — ED Triage Notes (Signed)
Left knee pain since yesterday.  Fell yesterday.

## 2021-01-14 NOTE — ED Provider Notes (Signed)
[facility]    CSN: [id number] Arrival date & time: 01/14/21  1743      History   Chief Complaint No chief complaint on file.   HPI Timothy Barrera is a 35 y.o. male.   Patient presenting today with 1 day history of left knee pain following a fall directly onto the knee yesterday.  He states there is a large abrasion to the knee that he has been keeping covered with a Band-Aid.  He is able to bear weight on the leg but it is very painful to do so.  Denies swelling, discoloration, numbness, tingling, diffuse edema down the leg.   Past Medical History:  Diagnosis Date   Speech impediment     There are no problems to display for this patient.   Past Surgical History:  Procedure Laterality Date   HERNIA REPAIR         Home Medications    Prior to Admission medications   Medication Sig Start Date End Date Taking? Authorizing Provider  chlorhexidine (HIBICLENS) 4 % external liquid Apply topically daily as needed. 01/14/21  Yes Particia Nearing, PA-C  Meloxicam 7.5 MG/5ML SUSP Take 5 mLs (7.5 mg total) by mouth 2 (two) times daily as needed. 01/14/21  Yes Particia Nearing, PA-C  gabapentin (NEURONTIN) 100 MG capsule Take 1 capsule (100 mg total) by mouth every 8 (eight) hours as needed. 03/31/20   Jacquelin Hawking, PA-C  mupirocin ointment (BACTROBAN) 2 % Apply 1 application topically 2 (two) times daily. Apply directly to leg wound with each dressing change. 01/14/21   Particia Nearing, PA-C  nystatin-triamcinolone ointment [facility]) Apply 1 application topically 2 (two) times daily. Apply to foot rash. 08/04/20   Bing Neighbors, FNP  predniSONE (DELTASONE) 20 MG tablet Take 1 tablet (20 mg total) by mouth 2 (two) times daily with a meal. Patient not taking: Reported on 05/05/2020 03/31/20   Jacquelin Hawking, PA-C    Family History History reviewed. No pertinent family history.  Social History Social History   Tobacco Use   Smoking  status: Every Day    Packs/day: 1.00    Types: Cigarettes   Smokeless tobacco: Never  Vaping Use   Vaping Use: Never used  Substance Use Topics   Alcohol use: Yes    Comment: socially   Drug use: No     Allergies   Patient has no known allergies.   Review of Systems Review of Systems Per HPI  Physical Exam Triage Vital Signs ED Triage Vitals  Enc Vitals Group     BP 01/14/21 1911 129/76     Pulse Rate 01/14/21 1911 (!) 52     Resp 01/14/21 1911 18     Temp 01/14/21 1911 (!) 97.5 F (36.4 C)     Temp Source 01/14/21 1911 Oral     SpO2 01/14/21 1911 97 %     Weight --      Height --      Head Circumference --      Peak Flow --      Pain Score 01/14/21 1912 9     Pain Loc --      Pain Edu? --      Excl. in GC? --    No data found.  Updated Vital Signs BP 129/76 (BP Location: Right Arm)   Pulse (!) 52   Temp (!) 97.5 F (36.4 C) (Oral)   Resp 18   SpO2 97%   Visual Acuity Right  Eye Distance:   Left Eye Distance:   Bilateral Distance:    Right Eye Near:   Left Eye Near:    Bilateral Near:     Physical Exam Vitals and nursing note reviewed.  Constitutional:      Appearance: Normal appearance.  HENT:     Head: Atraumatic.  Eyes:     Extraocular Movements: Extraocular movements intact.     Conjunctiva/sclera: Conjunctivae normal.  Cardiovascular:     Rate and Rhythm: Normal rate and regular rhythm.  Pulmonary:     Effort: Pulmonary effort is normal.     Breath sounds: Normal breath sounds.  Musculoskeletal:        General: Tenderness and signs of injury present. No swelling or deformity. Normal range of motion.     Cervical back: Normal range of motion and neck supple.     Comments: Diffuse anterior left knee tenderness to palpation but no joint laxity on drawer testing, negative McMurray's and no crepitus with passive or active range of motion  Skin:    General: Skin is warm.     Comments: Open abrasion to left anterior lower leg just below the  patella.  No evidence of infection at this time.  Neurological:     Mental Status: Mental status is at baseline.  Psychiatric:        Mood and Affect: Mood normal.        Thought Content: Thought content normal.     UC Treatments / Results  Labs (all labs ordered are listed, but only abnormal results are displayed) Labs Reviewed - No data to display  EKG   Radiology No results found.  Procedures Procedures (including critical care time)  Medications Ordered in UC Medications - No data to display  Initial Impression / Assessment and Plan / UC Course  I have reviewed the triage vital signs and the nursing notes.  Pertinent labs & imaging results that were available during my care of the patient were reviewed by me and considered in my medical decision making (see chart for details).     We will forego x-ray imaging today as the joint is moving well, no bony deformities palpable and he is ambulating on it.  Wound cleaned and dressed in clinic, Hibiclens, mupirocin, keep covered for home wound care.  Meloxicam liquid sent to pharmacy for pain relief.  RICE protocol reviewed.  Sports med follow-up if worsening or not resolving.  Final Clinical Impressions(s) / UC Diagnoses   Final diagnoses:  Acute pain of left knee  Fall, initial encounter  Abrasion of left knee, initial encounter   Discharge Instructions   None    ED Prescriptions     Medication Sig Dispense Auth. Provider   mupirocin ointment (BACTROBAN) 2 % Apply 1 application topically 2 (two) times daily. Apply directly to leg wound with each dressing change. 90 g Particia Nearing, [place]   chlorhexidine (HIBICLENS) 4 % external liquid Apply topically daily as needed. 120 mL Particia Nearing, [place]   Meloxicam 7.5 MG/5ML SUSP Take 5 mLs (7.5 mg total) by mouth 2 (two) times daily as needed. 50 mL Particia Nearing, [place]      PDMP not reviewed this encounter.   Particia Nearing,  [place] 01/14/21 1925

## 2021-01-26 ENCOUNTER — Other Ambulatory Visit (HOSPITAL_COMMUNITY): Payer: Self-pay | Admitting: Specialist

## 2021-01-26 DIAGNOSIS — R1319 Other dysphagia: Secondary | ICD-10-CM

## 2021-02-04 ENCOUNTER — Ambulatory Visit (HOSPITAL_COMMUNITY): Payer: 59 | Attending: Family Medicine | Admitting: Physical Therapy

## 2021-02-04 ENCOUNTER — Other Ambulatory Visit: Payer: Self-pay

## 2021-02-04 ENCOUNTER — Encounter (HOSPITAL_COMMUNITY): Payer: Self-pay | Admitting: Physical Therapy

## 2021-02-04 DIAGNOSIS — R2689 Other abnormalities of gait and mobility: Secondary | ICD-10-CM | POA: Diagnosis present

## 2021-02-04 DIAGNOSIS — R296 Repeated falls: Secondary | ICD-10-CM | POA: Diagnosis present

## 2021-02-04 DIAGNOSIS — R1319 Other dysphagia: Secondary | ICD-10-CM | POA: Diagnosis present

## 2021-02-04 DIAGNOSIS — M6281 Muscle weakness (generalized): Secondary | ICD-10-CM | POA: Diagnosis present

## 2021-02-04 NOTE — Therapy (Signed)
[facility] [address] [place], [place], [address] Phone: [phone]   Fax:  [phone]  Physical Therapy Evaluation  Patient Details  Name: Timothy Barrera MRN: [id number] Date of Birth: [birth year] Referring Provider (PT): Colvin Caroli FNP   Encounter Date: 02/04/2021   PT End of Session - 02/04/21 1725     Visit Number 1    Number of Visits 8    Date for PT Re-Evaluation 03/04/21    Authorization Type [employer]    PT Start Time 1645    PT Stop Time 1720    PT Time Calculation (min) 35 min    Activity Tolerance Patient tolerated treatment well    Behavior During Therapy [facility] for tasks assessed/performed             Past Medical History:  Diagnosis Date   Speech impediment     Past Surgical History:  Procedure Laterality Date   HERNIA REPAIR      There were no vitals filed for this visit.    Subjective Assessment - 02/04/21 1649     Subjective Patient states he is unsure why he is here today. When asked about his referral for falls, he states that his legs give out and he has had some recent falls.    Pertinent History MD    Limitations Standing;Walking    Patient Stated Goals To not need a cane    Currently in Pain? Yes    Pain Score 4     Pain Location Knee    Pain Orientation Right;Left    Pain Descriptors / Indicators --   swollen   Pain Type Acute pain    Pain Onset More than a month ago    Pain Frequency Intermittent    Aggravating Factors  WB    Pain Relieving Factors non WB    Effect of Pain on Daily Activities Limits                [facility] PT Assessment - 02/04/21 0001       Assessment   Medical Diagnosis muscular dystrpphy/ falls    Referring Provider (PT) Colvin Caroli FNP    Prior Therapy No      Precautions   Precautions Fall      Restrictions   Weight Bearing Restrictions No      Balance Screen   Has the patient fallen in the past 6 months Yes    How many times? 12     Has the patient had a decrease in activity level because of a fear of falling?  Yes    Is the patient reluctant to leave their home because of a fear of falling?  No      Home Environment   Living Environment Private residence    Living Arrangements Other relatives   lives with his disabled brother     Prior Function   Level of Independence Independent with basic ADLs      Cognition   Overall Cognitive Status Within Functional Limits for tasks assessed      Observation/Other Assessments   Observations poor standing balance, sway noted in static standing      ROM / Strength   AROM / PROM / Strength Strength      Strength   Strength Assessment Site Hip;Knee;Ankle    Right/Left Hip Right;Left    Right Hip Flexion 4+/5    Left Hip Flexion 4+/5    Right/Left Knee Right;Left  Right Knee Extension 4-/5    Left Knee Extension 3-/5    Right/Left Ankle Right;Left    Right Ankle Dorsiflexion 4+/5    Left Ankle Dorsiflexion 3+/5      Bed Mobility   Bed Mobility --   Patient states plinth is not large enough and he will not be able to get up from it     Transfers   Transfers Sit to Stand    Sit to Stand 4: Min guard;5: Supervision   poor weight shift, heavy use of UEs, poor coor control     Ambulation/Gait   Ambulation/Gait Yes    Ambulation/Gait Assistance 5: Supervision;6: Modified independent (Device/Increase time)    Assistive device Straight cane    Gait Pattern Decreased stride length;Decreased step length - right;Decreased step length - left;Poor foot clearance - left    Ambulation Surface Level;Indoor      Balance   Balance Assessed Yes      Static Standing Balance   Static Standing Balance -  Activities  Tandam Stance - Right Leg;Tandam Stance - Left Leg    Static Standing - Comment/# of Minutes <5 sec bilaterally                        Objective measurements completed on examination: See above findings.                PT Education -  02/04/21 1654     Education Details on evaluation findings, POC and HEP    Person(s) Educated Patient    Methods Explanation;Handout    Comprehension Verbalized understanding              PT Short Term Goals - 02/04/21 1729       PT SHORT TERM GOAL #1   Title Patient will be independent with initial HEP and self-management strategies to improve functional outcomes    Time 2    Period Weeks    Status New    Target Date 02/18/21               PT Long Term Goals - 02/04/21 1729       PT LONG TERM GOAL #1   Title Patient will be independent with advanced HEP and self-management strategies to improve functional outcomes    Time 4    Period Weeks    Status New    Target Date 03/04/21      PT LONG TERM GOAL #2   Title Patient will be able to ambulate at least 300 feet during with LRAD to demonstrate improved ability to perform functional mobility and associated tasks.    Time 4    Period Weeks    Status New    Target Date 03/04/21      PT LONG TERM GOAL #3   Title Patient will be able to maintain tandem stance >30 seconds on BLEs to improve stability and reduce risk for falls    Time 4    Period Weeks    Status New    Target Date 03/04/21                    Plan - 02/04/21 1726     Clinical Impression Statement Patient is a 35 y.o. male who presents to physical therapy with complaint of muscular dystrophy, balance difficulty and repeated falls. Patient demonstrates decreased strength, balance deficits and gait abnormalities which are negatively impacting patient ability to perform ADLs  and functional mobility tasks. Patient will benefit from skilled physical therapy services to address these deficits to improve level of function with ADLs, functional mobility tasks, and reduce risk for falls.    Personal Factors and Comorbidities Comorbidity 2    Comorbidities MD, falls    Examination-Activity Limitations Bed Mobility;Locomotion  Level;Stand;Transfers;Stairs    Stability/Clinical Decision Making Evolving/Moderate complexity    Clinical Decision Making Moderate    Rehab Potential Fair    PT Frequency 2x / week    PT Duration 4 weeks    PT Treatment/Interventions ADLs/Self Care Home Management;Aquatic Therapy;Biofeedback;Cryotherapy;Ultrasound;Traction;Functional mobility training;Neuromuscular re-education;Manual techniques;Compression bandaging;Scar mobilization;Orthotic Fit/Training;Passive range of motion;Balance training;Therapeutic exercise;Contrast Bath;DME Instruction;Electrical Stimulation;Iontophoresis 4mg /ml Dexamethasone;Gait training;Stair training;Moist Heat;Fluidtherapy;Parrafin;Therapeutic activities;Patient/family education;Manual lymph drainage;Dry needling;Visual/perceptual remediation/compensation;Energy conservation;Splinting;Spinal Manipulations;Vasopneumatic Device;Taping;Joint Manipulations    PT Next Visit Plan Review HEP. Progress functional LE strength as tolerated. Try large mat supine exercise, heel slide, bridge, SAQ. Progress to sit to stand (elevated surfaces), balance and gait as able    PT Home Exercise Plan Eval: seated march, LAQ    Consulted and Agree with Plan of Care Patient             Patient will benefit from skilled therapeutic intervention in order to improve the following deficits and impairments:  Abnormal gait, Decreased balance, Difficulty walking, Decreased safety awareness, Decreased activity tolerance, Decreased endurance, Decreased strength, Decreased range of motion, Hypomobility, Decreased mobility, Pain, Improper body mechanics, Increased edema  Visit Diagnosis: Muscle weakness (generalized)  Other abnormalities of gait and mobility  Repeated falls     Problem List There are no problems to display for this patient.  6:22 PM, 02/04/21 13/02/22 PT DPT  Physical Therapist with [facility]  [facility]  [phone]   [facility] [address] [place], [place], [place] Phone: [phone]   Fax:  [phone]  Name: Timothy Barrera MRN: [name] Date of Birth: Apr 19, 1985

## 2021-02-04 NOTE — Patient Instructions (Signed)
Access Code: [email or web address] URL: [email or web address] Date: 02/04/2021 Prepared by: Georges Lynch  Exercises Seated March - 3 x daily - 7 x weekly - 2 sets - 10 reps Seated Long Arc Quad - 3 x daily - 7 x weekly - 2 sets - 10 reps

## 2021-02-06 ENCOUNTER — Ambulatory Visit (HOSPITAL_COMMUNITY): Payer: 59

## 2021-02-06 ENCOUNTER — Telehealth (HOSPITAL_COMMUNITY): Payer: Self-pay

## 2021-02-06 NOTE — Telephone Encounter (Signed)
No show, called and left message concerning missed apt today.  Reminded next apt date and time wiht contact infomation included if needs to cancel/reschedule in the future.   Becky Sax, LPTA/CLT; [place] [phone]

## 2021-02-09 ENCOUNTER — Ambulatory Visit (HOSPITAL_COMMUNITY): Payer: 59 | Admitting: Physical Therapy

## 2021-02-09 ENCOUNTER — Other Ambulatory Visit: Payer: Self-pay

## 2021-02-09 DIAGNOSIS — R1319 Other dysphagia: Secondary | ICD-10-CM

## 2021-02-09 DIAGNOSIS — R2689 Other abnormalities of gait and mobility: Secondary | ICD-10-CM

## 2021-02-09 DIAGNOSIS — M6281 Muscle weakness (generalized): Secondary | ICD-10-CM | POA: Diagnosis not present

## 2021-02-09 DIAGNOSIS — R296 Repeated falls: Secondary | ICD-10-CM

## 2021-02-09 NOTE — Therapy (Signed)
[facility] [address] [place], [place], [address] Phone: [phone]   Fax:  [phone]  Physical Therapy Treatment  Patient Details  Name: Timothy Barrera MRN: [id number] Date of Birth: 1985-08-07 Referring Provider (PT): Colvin Caroli FNP   Encounter Date: 02/09/2021   PT End of Session - 02/09/21 1714     Visit Number 2    Number of Visits 8    Date for PT Re-Evaluation 03/04/21    Authorization Type [employer]    PT Start Time 1620    PT Stop Time 1700    PT Time Calculation (min) 40 min    Activity Tolerance Patient tolerated treatment well    Behavior During Therapy [facility] for tasks assessed/performed             Past Medical History:  Diagnosis Date   Speech impediment     Past Surgical History:  Procedure Laterality Date   HERNIA REPAIR      There were no vitals filed for this visit.   Subjective Assessment - 02/09/21 1623     Subjective pt states he's been doing the exercises and "they are hard".  STates he's Lt knee hurts a little today    Currently in Pain? Yes    Pain Score 5     Pain Location Knee    Pain Orientation Left    Pain Descriptors / Indicators Aching    Pain Type Acute pain                               [facility] Adult PT Treatment/Exercise - 02/09/21 0001       Knee/Hip Exercises: Seated   Long Arc Quad Right;Left;3 sets;10 reps    Marching Right;Left;3 sets;10 reps    Marching Limitations alternating    Sit to [employer] 10 reps;with UE support;without UE support   had to use UE's to stand but able to lower without use of UE     Knee/Hip Exercises: Supine   Short Arc Quad Sets AROM;Both;2 sets;10 reps    Short Arc Quad Sets Limitations Lt with some AAROM and rest breaks to achieve    Heel Slides AROM;2 sets;10 reps    Henreitta Leber Both;2 sets;10 reps                     PT Education - 02/09/21 1714     Education Details review of goals, HEP and POC  moving foward    Person(s) Educated Patient    Methods Explanation;Demonstration;Tactile cues;Verbal cues    Comprehension Verbalized understanding;Returned demonstration;Verbal cues required;Tactile cues required;Need further instruction              PT Short Term Goals - 02/09/21 1641       PT SHORT TERM GOAL #1   Title Patient will be independent with initial HEP and self-management strategies to improve functional outcomes    Time 2    Period Weeks    Status On-going    Target Date 02/18/21               PT Long Term Goals - 02/09/21 1641       PT LONG TERM GOAL #1   Title Patient will be independent with advanced HEP and self-management strategies to improve functional outcomes    Time 4    Period Weeks    Status On-going  PT LONG TERM GOAL #2   Title Patient will be able to ambulate at least 300 feet during with LRAD to demonstrate improved ability to perform functional mobility and associated tasks.    Time 4    Period Weeks    Status On-going      PT LONG TERM GOAL #3   Title Patient will be able to maintain tandem stance >30 seconds on BLEs to improve stability and reduce risk for falls    Time 4    Period Weeks    Status On-going                   Plan - 02/09/21 1715     Clinical Impression Statement Very difficult to understand patient.  Reviwed goals and HEP.  Pt required cues to complete therex more slowly, increase hold times and work on fulling extending LT LE.  Lt LE much weaker than Rt LE.  Added all therex in last visit plan.  STanding difficult and unable to complete without use of UE's even with surface elevated.  Pt was able to descend wihtout UE assist, however BOS far over toes to complete.  Educated to continue currentl HEP and to schedule more appt as he had none for the next 2 weeks.    Personal Factors and Comorbidities Comorbidity 2    Comorbidities MD, falls    Examination-Activity Limitations Bed  Mobility;Locomotion Level;Stand;Transfers;Stairs    Stability/Clinical Decision Making Evolving/Moderate complexity    Rehab Potential Fair    PT Frequency 2x / week    PT Duration 4 weeks    PT Treatment/Interventions ADLs/Self Care Home Management;Aquatic Therapy;Biofeedback;Cryotherapy;Ultrasound;Traction;Functional mobility training;Neuromuscular re-education;Manual techniques;Compression bandaging;Scar mobilization;Orthotic Fit/Training;Passive range of motion;Balance training;Therapeutic exercise;Contrast Bath;DME Instruction;Electrical Stimulation;Iontophoresis 4mg /ml Dexamethasone;Gait training;Stair training;Moist Heat;Fluidtherapy;Parrafin;Therapeutic activities;Patient/family education;Manual lymph drainage;Dry needling;Visual/perceptual remediation/compensation;Energy conservation;Splinting;Spinal Manipulations;Vasopneumatic Device;Taping;Joint Manipulations    PT Next Visit Plan Progress functional LE strength as tolerated. Update HEP next session to include added exercises.Progress to sit to stand (elevated surfaces), balance and gait as able    PT Home Exercise Plan Eval: seated march, LAQ    Consulted and Agree with Plan of Care Patient             Patient will benefit from skilled therapeutic intervention in order to improve the following deficits and impairments:  Abnormal gait, Decreased balance, Difficulty walking, Decreased safety awareness, Decreased activity tolerance, Decreased endurance, Decreased strength, Decreased range of motion, Hypomobility, Decreased mobility, Pain, Improper body mechanics, Increased edema  Visit Diagnosis: Muscle weakness (generalized)  Repeated falls  Other abnormalities of gait and mobility  Other dysphagia     Problem List There are no problems to display for this patient.  April, PTA/CLT, WTA [phone]  [phone], PTA 02/09/2021, 5:18 PM  [facility] [address]  [place], [place], [place] Phone: [phone]   Fax:  [phone]  Name: Timothy Barrera MRN: [name] Date of Birth: Mar 10, 1986

## 2021-02-10 ENCOUNTER — Inpatient Hospital Stay (HOSPITAL_COMMUNITY): Admission: RE | Admit: 2021-02-10 | Payer: 59 | Source: Ambulatory Visit

## 2021-02-10 ENCOUNTER — Ambulatory Visit (HOSPITAL_COMMUNITY): Payer: 59 | Admitting: Speech Pathology

## 2021-02-11 ENCOUNTER — Encounter (HOSPITAL_COMMUNITY): Payer: Self-pay

## 2021-02-11 ENCOUNTER — Other Ambulatory Visit (HOSPITAL_COMMUNITY): Payer: Self-pay | Admitting: Family Medicine

## 2021-02-11 ENCOUNTER — Other Ambulatory Visit: Payer: Self-pay | Admitting: Family Medicine

## 2021-02-11 DIAGNOSIS — R600 Localized edema: Secondary | ICD-10-CM

## 2021-02-17 ENCOUNTER — Encounter (HOSPITAL_COMMUNITY): Payer: Self-pay | Admitting: Physical Therapy

## 2021-02-17 ENCOUNTER — Other Ambulatory Visit: Payer: Self-pay

## 2021-02-17 ENCOUNTER — Inpatient Hospital Stay (HOSPITAL_COMMUNITY): Admission: RE | Admit: 2021-02-17 | Payer: Self-pay | Source: Ambulatory Visit

## 2021-02-17 ENCOUNTER — Ambulatory Visit (HOSPITAL_COMMUNITY): Payer: 59 | Admitting: Physical Therapy

## 2021-02-17 ENCOUNTER — Ambulatory Visit (HOSPITAL_COMMUNITY): Payer: 59 | Admitting: Speech Pathology

## 2021-02-17 DIAGNOSIS — R1319 Other dysphagia: Secondary | ICD-10-CM

## 2021-02-17 DIAGNOSIS — M6281 Muscle weakness (generalized): Secondary | ICD-10-CM

## 2021-02-17 DIAGNOSIS — R2689 Other abnormalities of gait and mobility: Secondary | ICD-10-CM

## 2021-02-17 DIAGNOSIS — R296 Repeated falls: Secondary | ICD-10-CM

## 2021-02-17 NOTE — Patient Instructions (Signed)
Access Code: [facility] URL: [email or web address] Date: 02/17/2021 Prepared by: Georges Lynch  Exercises Standing Hip Abduction with Counter Support - 2 x daily - 7 x weekly - 2 sets - 10 reps Standing Hip Extension with Counter Support - 2 x daily - 7 x weekly - 2 sets - 10 reps Standing Tandem Balance with Counter Support - 2 x daily - 7 x weekly - 1 sets - 3 reps - 20-30 second hold Sit to Stand with Counter Support - 2 x daily - 7 x weekly - 2 sets - 10 reps

## 2021-02-17 NOTE — Therapy (Signed)
[facility] [address] [place], [place], [address] Phone: [phone]   Fax:  [phone]  Physical Therapy Treatment  Patient Details  Name: Timothy Barrera MRN: [id number] Date of Birth: 03-08-1986 Referring Provider (PT): Colvin Caroli FNP   Encounter Date: 02/17/2021   PT End of Session - 02/17/21 1653     Visit Number 3    Number of Visits 8    Date for PT Re-Evaluation 03/04/21    Authorization Type [employer]    PT Start Time 1650    PT Stop Time 1728    PT Time Calculation (min) 38 min    Activity Tolerance Patient tolerated treatment well    Behavior During Therapy [facility] for tasks assessed/performed             Past Medical History:  Diagnosis Date   Speech impediment     Past Surgical History:  Procedure Laterality Date   HERNIA REPAIR      There were no vitals filed for this visit.   Subjective Assessment - 02/17/21 1653     Subjective Patient reports compliance with home exercise. Says these are going good.    Pertinent History MD    Limitations Standing;Walking    Currently in Pain? Yes    Pain Score 10-Worst pain ever    Pain Location Knee    Pain Orientation Left    Pain Descriptors / Indicators Aching    Pain Type Acute pain                               [facility] Adult PT Treatment/Exercise - 02/17/21 0001       Knee/Hip Exercises: Aerobic   Nustep 5 min warmup Lv 4 (decreased to 2 at 3 minutes due to faitgue)      Knee/Hip Exercises: Standing   Knee Flexion 1 set;5 reps    Knee Flexion Limitations unable to do, partial range, very difficult    Hip Flexion 10 reps;2 sets    Hip Abduction 10 reps;2 sets    Hip Extension Both;2 sets;10 reps    Other Standing Knee Exercises semi tandem stance 2 x 20"      Knee/Hip Exercises: Seated   Sit to Sand 10 reps;with UE support   elevated surface                      PT Short Term Goals - 02/09/21 1641        PT SHORT TERM GOAL #1   Title Patient will be independent with initial HEP and self-management strategies to improve functional outcomes    Time 2    Period Weeks    Status On-going    Target Date 02/18/21               PT Long Term Goals - 02/09/21 1641       PT LONG TERM GOAL #1   Title Patient will be independent with advanced HEP and self-management strategies to improve functional outcomes    Time 4    Period Weeks    Status On-going      PT LONG TERM GOAL #2   Title Patient will be able to ambulate at least 300 feet during with LRAD to demonstrate improved ability to perform functional mobility and associated tasks.    Time 4    Period Weeks    Status On-going  PT LONG TERM GOAL #3   Title Patient will be able to maintain tandem stance >30 seconds on BLEs to improve stability and reduce risk for falls    Time 4    Period Weeks    Status On-going                   Plan - 02/17/21 1722     Clinical Impression Statement Patient tolerated session well overall today. Continues to note increased difficulty with activity involving LLE due to weakness and knee pain. Introduced semi tandem stance for balance, patient did well with this. Unable to complete standing knee flexion due to weakness and LT knee pain. Able to progress standing LE strengthening exercises with added hip abduction and extension with support. Issued updated HEP. Patient will continue to benefit from skilled therapy services to progress LE strength and balance to reduce knee pain and improve functional mobility.    Personal Factors and Comorbidities Comorbidity 2    Comorbidities MD, falls    Examination-Activity Limitations Bed Mobility;Locomotion Level;Stand;Transfers;Stairs    Stability/Clinical Decision Making Evolving/Moderate complexity    Rehab Potential Fair    PT Frequency 2x / week    PT Duration 4 weeks    PT Treatment/Interventions ADLs/Self Care Home Management;Aquatic  Therapy;Biofeedback;Cryotherapy;Ultrasound;Traction;Functional mobility training;Neuromuscular re-education;Manual techniques;Compression bandaging;Scar mobilization;Orthotic Fit/Training;Passive range of motion;Balance training;Therapeutic exercise;Contrast Bath;DME Instruction;Electrical Stimulation;Iontophoresis 4mg /ml Dexamethasone;Gait training;Stair training;Moist Heat;Fluidtherapy;Parrafin;Therapeutic activities;Patient/family education;Manual lymph drainage;Dry needling;Visual/perceptual remediation/compensation;Energy conservation;Splinting;Spinal Manipulations;Vasopneumatic Device;Taping;Joint Manipulations    PT Next Visit Plan Progress functional LE strength as tolerated. Progress to sit to stand (elevated surfaces), balance and gait as able. step ups, sidestepping if able    PT Home Exercise Plan Eval: seated march, LAQ 11/15 sit to stand with support, hip abduction/ extension, tandem stance    Consulted and Agree with Plan of Care Patient             Patient will benefit from skilled therapeutic intervention in order to improve the following deficits and impairments:  Abnormal gait, Decreased balance, Difficulty walking, Decreased safety awareness, Decreased activity tolerance, Decreased endurance, Decreased strength, Decreased range of motion, Hypomobility, Decreased mobility, Pain, Improper body mechanics, Increased edema  Visit Diagnosis: Muscle weakness (generalized)  Repeated falls  Other abnormalities of gait and mobility  Other dysphagia     Problem List There are no problems to display for this patient.  5:31 PM, 02/17/21 02/19/21 PT DPT  Physical Therapist with [facility]  [facility]  [phone]   [facility] [address] [place], [place], [place] Phone: [phone]   Fax:  [phone]  Name: Timothy Barrera MRN: Timothy Barrera Date of Birth: March 14, 1986

## 2021-02-18 ENCOUNTER — Encounter (HOSPITAL_COMMUNITY): Payer: Self-pay

## 2021-02-18 ENCOUNTER — Ambulatory Visit (HOSPITAL_COMMUNITY): Admission: RE | Admit: 2021-02-18 | Payer: 59 | Source: Ambulatory Visit

## 2021-02-19 ENCOUNTER — Telehealth (HOSPITAL_COMMUNITY): Payer: Self-pay | Admitting: Physical Therapy

## 2021-02-19 ENCOUNTER — Other Ambulatory Visit (HOSPITAL_COMMUNITY): Payer: Self-pay

## 2021-02-19 ENCOUNTER — Ambulatory Visit (HOSPITAL_COMMUNITY): Payer: 59 | Admitting: Physical Therapy

## 2021-02-19 NOTE — Telephone Encounter (Signed)
Pt did not show for appointment today.  Called and spoke to patient without reason given for missing appt.  Kindly requested to call and cancel prior to appt if he is unable to make them.  Reminded of next appt on Tuesday 11/22 at 4:15  Lurena Nida, PTA/CLT, Margarita Rana [phone]

## 2021-02-20 ENCOUNTER — Other Ambulatory Visit (HOSPITAL_COMMUNITY): Payer: Self-pay | Admitting: Specialist

## 2021-02-20 DIAGNOSIS — R1312 Dysphagia, oropharyngeal phase: Secondary | ICD-10-CM

## 2021-02-24 ENCOUNTER — Ambulatory Visit (HOSPITAL_COMMUNITY): Payer: 59

## 2021-02-25 ENCOUNTER — Ambulatory Visit (HOSPITAL_COMMUNITY): Payer: 59 | Attending: Family Medicine

## 2021-03-03 ENCOUNTER — Ambulatory Visit
Admission: EM | Admit: 2021-03-03 | Discharge: 2021-03-03 | Disposition: A | Discharge status: Home / Self Care | Payer: 59

## 2021-03-03 ENCOUNTER — Other Ambulatory Visit: Payer: Self-pay

## 2021-03-03 ENCOUNTER — Ambulatory Visit (HOSPITAL_COMMUNITY): Payer: 59

## 2021-03-03 DIAGNOSIS — Z20828 Contact with and (suspected) exposure to other viral communicable diseases: Secondary | ICD-10-CM | POA: Diagnosis not present

## 2021-03-03 DIAGNOSIS — T148XXA Other injury of unspecified body region, initial encounter: Secondary | ICD-10-CM | POA: Diagnosis not present

## 2021-03-03 MED ORDER — CEPHALEXIN 250 MG/5ML PO SUSR: 500.0000 mg | Freq: Four times a day (QID) | ORAL | 0 refills | Status: AC

## 2021-03-03 MED ORDER — ONDANSETRON 8 MG PO TBDP: 8.0000 mg | ORAL_TABLET | Freq: Three times a day (TID) | ORAL | 0 refills | Status: DC | PRN

## 2021-03-03 NOTE — Discharge Instructions (Addendum)
-  Take the Zofran (ondansetron) up to 3 times daily for nausea and vomiting. Dissolve one pill under your tongue or between your teeth and your cheek. -Start the antibiotic: Keflex, 4x daily x5 days. You can take this with food if you have a sensitive stomach. -Wash your wound with gentle soap and water 1-2 times daily.  Let air dry or gently pat. You can follow with over-the-counter neosporin ointment (or similar). Keep wrapped during the day or when you're doing something that could get it dirty (working, [employer], cooking, Catering manager). Avoid cleansing with hydrogen peroxide or alcohol!! -Seek additional medical attention if the wound is getting worse instead of better- redness increasing in size, pain getting worse, new/worsening discharge, new fevers/chills, etc. -If your abdominal pain is getting worse head to the ED

## 2021-03-03 NOTE — ED Provider Notes (Signed)
[facility]    CSN: [id number] Arrival date & time: 03/03/21  1114      History   Chief Complaint No chief complaint on file.   HPI Timothy Barrera is a 35 y.o. male presenting with nausea without vomiting and R shin skin avulsion. Describes few days of nausea and generalized crampy abd pain without vomiting or diarrhea. Bms are regular, still passing gas. Denies cough, congestion, SOB. Also with R shin avulsion skin, unsure when this happened or how but it is painful. States he is not immunocompromised. Tdap UTD per pt.  HPI  Past Medical History:  Diagnosis Date   Speech impediment     There are no problems to display for this patient.   Past Surgical History:  Procedure Laterality Date   HERNIA REPAIR         Home Medications    Prior to Admission medications   Medication Sig Start Date End Date Taking? Authorizing Provider  cephALEXin (KEFLEX) 250 MG/5ML suspension Take 10 mLs (500 mg total) by mouth 4 (four) times daily for 5 days. 03/03/21 03/08/21 Yes Rhys Martini, PA-C  ondansetron (ZOFRAN-ODT) 8 MG disintegrating tablet Take 1 tablet (8 mg total) by mouth every 8 (eight) hours as needed for nausea or vomiting. 03/03/21  Yes Rhys Martini, PA-C  traMADol (ULTRAM) 50 MG tablet Take 50 mg by mouth every 6 (six) hours as needed. 01/07/21   [provider]  varenicline (CHANTIX) 0.5 MG tablet Take by mouth. 11/17/20   [provider]    Family History History reviewed. No pertinent family history.  Social History Social History   Tobacco Use   Smoking status: Every Day    Packs/day: 1.00    Types: Cigarettes   Smokeless tobacco: Never  Vaping Use   Vaping Use: Never used  Substance Use Topics   Alcohol use: Yes    Comment: socially   Drug use: No     Allergies   Patient has no known allergies.   Review of Systems Review of Systems  Constitutional:  Negative for appetite change, chills and fever.  HENT:   Negative for congestion, ear pain, rhinorrhea, sinus pressure, sinus pain and sore throat.   Eyes:  Negative for redness and visual disturbance.  Respiratory:  Negative for cough, chest tightness, shortness of breath and wheezing.   Cardiovascular:  Negative for chest pain and palpitations.  Gastrointestinal:  Positive for nausea. Negative for abdominal pain, constipation, diarrhea and vomiting.  Genitourinary:  Negative for dysuria, frequency and urgency.  Musculoskeletal:  Negative for myalgias.  Skin:  Positive for wound.  Neurological:  Negative for dizziness, weakness and headaches.  Psychiatric/Behavioral:  Negative for confusion.   All other systems reviewed and are negative.   Physical Exam Triage Vital Signs ED Triage Vitals  Enc Vitals Group     BP 03/03/21 1534 121/79     Pulse Rate 03/03/21 1534 68     Resp 03/03/21 1534 20     Temp 03/03/21 1534 97.7 F (36.5 C)     Temp Source 03/03/21 1534 Oral     SpO2 03/03/21 1534 98 %     Weight --      Height --      Head Circumference --      Peak Flow --      Pain Score 03/03/21 1531 10     Pain Loc --      Pain Edu? --      Excl.  in GC? --    No data found.  Updated Vital Signs BP 121/79 (BP Location: Right Arm)   Pulse 68   Temp 97.7 F (36.5 C) (Oral)   Resp 20   SpO2 98%   Visual Acuity Right Eye Distance:   Left Eye Distance:   Bilateral Distance:    Right Eye Near:   Left Eye Near:    Bilateral Near:     Physical Exam Vitals reviewed.  Constitutional:      General: He is not in acute distress.    Appearance: Normal appearance. He is not ill-appearing.  HENT:     Head: Normocephalic and atraumatic.     Right Ear: Tympanic membrane, ear canal and external ear normal. No tenderness. No middle ear effusion. There is no impacted cerumen. Tympanic membrane is not perforated, erythematous, retracted or bulging.     Left Ear: Tympanic membrane, ear canal and external ear normal. No tenderness.  No middle  ear effusion. There is no impacted cerumen. Tympanic membrane is not perforated, erythematous, retracted or bulging.     Nose: Nose normal. No congestion.     Mouth/Throat:     Mouth: Mucous membranes are moist.     Pharynx: Uvula midline. No oropharyngeal exudate or posterior oropharyngeal erythema.  Eyes:     Extraocular Movements: Extraocular movements intact.     Pupils: Pupils are equal, round, and reactive to light.  Cardiovascular:     Rate and Rhythm: Normal rate and regular rhythm.     Heart sounds: Normal heart sounds.  Pulmonary:     Effort: Pulmonary effort is normal.     Breath sounds: Normal breath sounds. No decreased breath sounds, wheezing, rhonchi or rales.  Abdominal:     General: Bowel sounds are increased.     Palpations: Abdomen is soft.     Tenderness: There is no abdominal tenderness. There is no guarding or rebound. Negative signs include Murphy's sign, Rovsing's sign and McBurney's sign.     Comments: BS increased throughout  Lymphadenopathy:     Cervical: No cervical adenopathy.     Right cervical: No superficial cervical adenopathy.    Left cervical: No superficial cervical adenopathy.  Skin:    Comments: See image below R shin with 2cm skin avulsion with some surrounding erythema and warmth. 1+ pedal edema apperas to be his baseline.no calf swelling or tenderness.   Neurological:     General: No focal deficit present.     Mental Status: He is alert and oriented to person, place, and time.  Psychiatric:        Mood and Affect: Mood normal.        Behavior: Behavior normal.        Thought Content: Thought content normal.        Judgment: Judgment normal.      UC Treatments / Results  Labs (all labs ordered are listed, but only abnormal results are displayed) Labs Reviewed  COVID-19, FLU A+B NAA    EKG   Radiology No results found.  Procedures Procedures (including critical care time)  Medications Ordered in UC Medications - No data to  display  Initial Impression / Assessment and Plan / UC Course  I have reviewed the triage vital signs and the nursing notes.  Pertinent labs & imaging results that were available during my care of the patient were reviewed by me and considered in my medical decision making (see chart for details).     This patient  is a very pleasant 35 y.o. year old male presenting with R shin skin avulsion and nausea without vomiting. Today this pt is afebrile nontachycardic nontachypneic, oxygenating well on room air, no wheezes rhonchi or rales.   Tdap UTD per pt States he is not immunocompromised Keflex suspension for leg, he cannot swallow pills Zofran ODT for nausea  Strict ED return precautions discussed. Patient verbalizes understanding and agreement.     Final Clinical Impressions(s) / UC Diagnoses   Final diagnoses:  Exposure to the flu  Skin avulsion     Discharge Instructions      -Take the Zofran (ondansetron) up to 3 times daily for nausea and vomiting. Dissolve one pill under your tongue or between your teeth and your cheek. -Start the antibiotic: Keflex, 4x daily x5 days. You can take this with food if you have a sensitive stomach. -Wash your wound with gentle soap and water 1-2 times daily.  Let air dry or gently pat. You can follow with over-the-counter neosporin ointment (or similar). Keep wrapped during the day or when you're doing something that could get it dirty (working, [employer], cooking, Catering manager). Avoid cleansing with hydrogen peroxide or alcohol!! -Seek additional medical attention if the wound is getting worse instead of better- redness increasing in size, pain getting worse, new/worsening discharge, new fevers/chills, etc. -If your abdominal pain is getting worse head to the ED     ED Prescriptions     Medication Sig Dispense Auth. Provider   cephALEXin (KEFLEX) 250 MG/5ML suspension Take 10 mLs (500 mg total) by mouth 4 (four) times daily for 5 days. 200 mL Ignacia Bayley E, PA-C   ondansetron (ZOFRAN-ODT) 8 MG disintegrating tablet Take 1 tablet (8 mg total) by mouth every 8 (eight) hours as needed for nausea or vomiting. 20 tablet Rhys Martini, PA-C      PDMP not reviewed this encounter.   Rhys Martini, PA-C 03/03/21 1800

## 2021-03-03 NOTE — ED Triage Notes (Signed)
Patient states he is having stomach pain, with a headache. He states he has shallow breathing. All of these symptoms started on Sunday.  Denies Meds  Denies Fever.  Denies Exposure.

## 2021-03-04 ENCOUNTER — Emergency Department (HOSPITAL_COMMUNITY)
Admission: EM | Admit: 2021-03-04 | Discharge: 2021-03-04 | Disposition: A | Discharge status: Home / Self Care | Payer: 59 | Attending: Emergency Medicine | Admitting: Emergency Medicine

## 2021-03-04 ENCOUNTER — Encounter (HOSPITAL_COMMUNITY): Payer: Self-pay | Admitting: *Deleted

## 2021-03-04 DIAGNOSIS — R197 Diarrhea, unspecified: Secondary | ICD-10-CM | POA: Diagnosis not present

## 2021-03-04 DIAGNOSIS — Z5321 Procedure and treatment not carried out due to patient leaving prior to being seen by health care provider: Secondary | ICD-10-CM | POA: Diagnosis not present

## 2021-03-04 DIAGNOSIS — R109 Unspecified abdominal pain: Secondary | ICD-10-CM | POA: Insufficient documentation

## 2021-03-04 LAB — COVID-19, FLU A+B NAA
Influenza A, NAA: NOT DETECTED
Influenza B, NAA: NOT DETECTED
SARS-CoV-2, NAA: NOT DETECTED

## 2021-03-04 NOTE — ED Notes (Signed)
Patient is not in the waiting area

## 2021-03-04 NOTE — ED Triage Notes (Signed)
Abdominal pain for 5 days, denies vomiting but does have diarrhea. Refuses to sit in chair during triage

## 2021-03-05 ENCOUNTER — Ambulatory Visit (HOSPITAL_COMMUNITY): Payer: 59

## 2021-03-10 ENCOUNTER — Ambulatory Visit (HOSPITAL_COMMUNITY): Payer: 59 | Attending: Family Medicine | Admitting: Speech Pathology

## 2021-03-10 ENCOUNTER — Other Ambulatory Visit (HOSPITAL_COMMUNITY): Payer: 59

## 2021-03-13 ENCOUNTER — Ambulatory Visit (HOSPITAL_COMMUNITY): Admit: 2021-03-13 | Payer: 59

## 2021-03-16 ENCOUNTER — Ambulatory Visit (HOSPITAL_COMMUNITY): Payer: 59 | Admitting: Physical Therapy

## 2021-05-18 NOTE — Progress Notes (Unsigned)
° °  NEUROLOGY FOLLOW UP OFFICE NOTE  [name] [id number]  Assessment/Plan:   Myotonic dystrophy Idiopathic polyneuropathy  Refer to following specialties for routine evaluation and monitoring: Cardiology Ophthalmology Pulmonology  Continue PT/OT Encourage routine aerobic exercise If excessive daytime fatigue, sleep study MEXILETINE   Subjective:  Timothy Barrera is a 36 year old right-handed male who follows up for myotonic dystrophy.  UPDATE: Last seen in July 2021.  Labs in July 2021 included CK 132 and aldolase 7.9.  NCV-EMG on 01/01/2020 showed distal predominant myopathy with myotonic discharges suggestive of myotonic dystrophy but also showed evidence of a sensorimotor axonal polyneuropathy.  Neuropathy labs in October 2021 included negative ANA, ACE 18, negative SSA/SSB antibodies, B6 3.9, and B12 level of 272.  He was advised to start OTC B12 supplement.  He had been going to ***  HISTORY:  He started having problems with bilateral leg weakness after his father passed away in 08-19-2017.  Sometimes when he walks, he notes sharp pain in the right knee.  He notes swelling in the legs.  He notes some tightness in the back of his right knee.  Arterial ABI was negative.  He has fallen.  No back pain, radicular pain in the legs or numbness.  Reports some difficulty swallowing pills.  Has trouble opening jars.  Sometimes his hands get stuck.  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   Family history of muscular dystrophy:  Maternal grandfather, brother (myotonic dystrophy), first cousin on mother's side  PAST MEDICAL HISTORY: Past Medical History:  Diagnosis Date   Speech impediment     MEDICATIONS: Current Outpatient Medications on File Prior to Visit  Medication Sig Dispense Refill   ondansetron (ZOFRAN-ODT) 8 MG disintegrating tablet Take 1 tablet (8 mg total) by mouth every 8 (eight) hours as needed for nausea or vomiting. 20 tablet 0    traMADol (ULTRAM) 50 MG tablet Take 50 mg by mouth every 6 (six) hours as needed.     varenicline (CHANTIX) 0.5 MG tablet Take by mouth.     No current facility-administered medications on file prior to visit.    ALLERGIES: No Known Allergies  FAMILY HISTORY: No family history on file.    Objective:  *** General: No acute distress.  Patient appears well-groomed.   Head:  Normocephalic/atraumatic Eyes:  Fundi examined but not visualized Neck: supple, no paraspinal tenderness, full range of motion Heart:  Regular rate and rhythm Lungs:  Clear to auscultation bilaterally Back: No paraspinal tenderness Neurological Exam: alert and oriented to person, place, and time.  Speech fluent and not dysarthric, language intact.  CN II-XII intact. Bulk and tone normal, muscle strength 4+/5 triceps, biceps, grip, ankle dorsiflexion, otherwise 5/5 throughout.  Sensation to light touch intact.  Deep tendon reflexes 2+ throughout, toes downgoing.  Finger to nose testing intact.  Mildly broad-based gait.  Romberg negative   Shon Millet, DO

## 2021-05-20 ENCOUNTER — Ambulatory Visit: Payer: No Typology Code available for payment source | Admitting: Neurology

## 2021-05-22 ENCOUNTER — Other Ambulatory Visit: Payer: Self-pay

## 2021-05-22 ENCOUNTER — Ambulatory Visit
Admission: EM | Admit: 2021-05-22 | Discharge: 2021-05-22 | Disposition: A | Discharge status: Home / Self Care | Payer: No Typology Code available for payment source

## 2021-05-22 NOTE — ED Notes (Signed)
Pt not found in waiting room, attempted to call x2. No answer either call.

## 2021-06-29 ENCOUNTER — Other Ambulatory Visit: Payer: Self-pay

## 2021-06-29 ENCOUNTER — Ambulatory Visit
Admission: EM | Admit: 2021-06-29 | Discharge: 2021-06-29 | Disposition: A | Discharge status: Home / Self Care | Payer: No Typology Code available for payment source

## 2021-06-29 NOTE — ED Triage Notes (Deleted)
Pt reports

## 2021-06-29 NOTE — ED Notes (Signed)
Pt called in lobby no answered.  ? ?Called to pt phone, pt hangout the call.  ?

## 2021-06-30 ENCOUNTER — Ambulatory Visit
Admission: EM | Admit: 2021-06-30 | Discharge: 2021-06-30 | Disposition: A | Discharge status: Home / Self Care | Payer: No Typology Code available for payment source | Attending: Urgent Care | Admitting: Urgent Care

## 2021-06-30 ENCOUNTER — Other Ambulatory Visit: Payer: Self-pay

## 2021-06-30 ENCOUNTER — Encounter: Payer: Self-pay | Admitting: Emergency Medicine

## 2021-06-30 DIAGNOSIS — R1030 Lower abdominal pain, unspecified: Secondary | ICD-10-CM

## 2021-06-30 DIAGNOSIS — R110 Nausea: Secondary | ICD-10-CM | POA: Diagnosis not present

## 2021-06-30 MED ORDER — ONDANSETRON 8 MG PO TBDP: 8.0000 mg | ORAL_TABLET | Freq: Three times a day (TID) | ORAL | 0 refills | Status: AC | PRN

## 2021-06-30 MED ORDER — ACETAMINOPHEN 160 MG/5ML PO SUSP: 640.0000 mg | Freq: Four times a day (QID) | ORAL | 0 refills | Status: AC | PRN

## 2021-06-30 NOTE — ED Triage Notes (Signed)
ABD pain since this morning.  States he has been nauseated.   ?

## 2021-06-30 NOTE — ED Provider Notes (Signed)
?[facility] ? ? ?MRN: [id number] DOB: Sep 15, 1985 ? ?Subjective:  ? ?Timothy Barrera is a 36 y.o. male presenting for 2-day history of acute onset persistent lower abdominal pain, nausea without vomiting.  Denies fever, chest pain, shortness of breath, dysuria, hematuria, constipation, diarrhea, bloody stools.  No history of GI disorders.  Has never had to have gastrointestinal work-up including endoscopy, colonoscopy.  He does have a PCP that plans on following up with him.  Reports that he cannot do any kind of pills for medication or chewable tablets. ? ?No current facility-administered medications for this encounter. ? ?Current Outpatient Medications:  ?  ondansetron (ZOFRAN-ODT) 8 MG disintegrating tablet, Take 1 tablet (8 mg total) by mouth every 8 (eight) hours as needed for nausea or vomiting., Disp: 20 tablet, Rfl: 0 ?  traMADol (ULTRAM) 50 MG tablet, Take 50 mg by mouth every 6 (six) hours as needed., Disp: , Rfl:  ?  varenicline (CHANTIX) 0.5 MG tablet, Take by mouth., Disp: , Rfl:   ? ?No Known Allergies ? ?Past Medical History:  ?Diagnosis Date  ? Speech impediment   ?  ? ?Past Surgical History:  ?Procedure Laterality Date  ? HERNIA REPAIR    ? ? ?History reviewed. No pertinent family history. ? ?Social History  ? ?Tobacco Use  ? Smoking status: Every Day  ?  Packs/day: 1.00  ?  Types: Cigarettes  ? Smokeless tobacco: Never  ?Vaping Use  ? Vaping Use: Never used  ?Substance Use Topics  ? Alcohol use: Yes  ?  Comment: socially  ? Drug use: No  ? ? ?ROS ? ? ?Objective:  ? ?Vitals: ?BP 133/83 (BP Location: Right Arm)   Pulse 80   Temp 97.6 ?F (36.4 ?C) (Oral)   Resp 18   SpO2 95%  ? ?Physical Exam ?Constitutional:   ?   General: He is not in acute distress. ?   Appearance: Normal appearance. He is well-developed and normal weight. He is not ill-appearing, toxic-appearing or diaphoretic.  ?HENT:  ?   Head: Normocephalic and atraumatic.  ?   Right Ear: External ear normal.  ?   Left  Ear: External ear normal.  ?   Nose: Nose normal.  ?   Mouth/Throat:  ?   Pharynx: Oropharynx is clear.  ?Eyes:  ?   General: No scleral icterus.    ?   Right eye: No discharge.     ?   Left eye: No discharge.  ?   Extraocular Movements: Extraocular movements intact.  ?Cardiovascular:  ?   Rate and Rhythm: Normal rate.  ?Pulmonary:  ?   Effort: Pulmonary effort is normal.  ?Abdominal:  ?   General: Bowel sounds are normal. There is no distension.  ?   Palpations: Abdomen is soft. There is no mass.  ?   Tenderness: There is abdominal tenderness in the right lower quadrant, suprapubic area and left lower quadrant. There is no right CVA tenderness, left CVA tenderness, guarding or rebound.  ?Musculoskeletal:  ?   Cervical back: Normal range of motion.  ?Neurological:  ?   Mental Status: He is alert and oriented to person, place, and time.  ?Psychiatric:     ?   Mood and Affect: Mood normal.     ?   Behavior: Behavior normal.     ?   Thought Content: Thought content normal.     ?   Judgment: Judgment normal.  ? ? ?Assessment and Plan :  ? ?PDMP  not reviewed this encounter. ? ?1. Lower abdominal pain   ?2. Nausea without vomiting   ? ?No signs of an acute abdomen.  Patient has hemodynamically stable vital signs and no red flags from his HPI.  I have low suspicion for renal colic and patient did not want to provide a urine sample.  I do not believe that the abdominal x-ray would be high yield as he is defecating normally, does not have any distention, low risk factors for obstruction.  Recommended close follow-up with his PCP, consideration for CT scan as deemed necessary.  Maintain strict ER precautions.  In the meantime, use Tylenol for pain relief, Zofran for nausea. Counseled patient on potential for adverse effects with medications prescribed today, patient verbalized understanding.  ?  ?Jaynee Eagles, PA-C ?06/30/21 1519 ? ?

## 2021-07-02 ENCOUNTER — Encounter (HOSPITAL_COMMUNITY): Payer: Self-pay

## 2021-07-02 ENCOUNTER — Emergency Department (HOSPITAL_COMMUNITY)
Admission: EM | Admit: 2021-07-02 | Discharge: 2021-07-02 | Disposition: A | Discharge status: Home / Self Care | Payer: No Typology Code available for payment source | Attending: Emergency Medicine | Admitting: Emergency Medicine

## 2021-07-02 ENCOUNTER — Other Ambulatory Visit: Payer: Self-pay

## 2021-07-02 DIAGNOSIS — Z5321 Procedure and treatment not carried out due to patient leaving prior to being seen by health care provider: Secondary | ICD-10-CM | POA: Insufficient documentation

## 2021-07-02 DIAGNOSIS — G7241 Inclusion body myositis [IBM]: Secondary | ICD-10-CM | POA: Insufficient documentation

## 2021-07-02 LAB — COMPREHENSIVE METABOLIC PANEL
ALT: 16 U/L (ref 0–44)
AST: 20 U/L (ref 15–41)
Albumin: 4.9 g/dL (ref 3.5–5.0)
Alkaline Phosphatase: 88 U/L (ref 38–126)
Anion gap: 13 (ref 5–15)
BUN: 10 mg/dL (ref 6–20)
CO2: 28 mmol/L (ref 22–32)
Calcium: 9.9 mg/dL (ref 8.9–10.3)
Chloride: 100 mmol/L (ref 98–111)
Creatinine, Ser: 0.43 mg/dL — ABNORMAL LOW (ref 0.61–1.24)
GFR, Estimated: >60 mL/min (ref >60)
Glucose, Bld: 80 mg/dL (ref 70–99)
Potassium: 4.8 mmol/L (ref 3.5–5.1)
Sodium: 141 mmol/L (ref 135–145)
Total Bilirubin: 3.3 mg/dL — ABNORMAL HIGH (ref 0.3–1.2)
Total Protein: 8.4 g/dL — ABNORMAL HIGH (ref 6.5–8.1)

## 2021-07-02 LAB — CBC
HCT: 53.2 % — ABNORMAL HIGH (ref 39.0–52.0)
Hemoglobin: 17.3 g/dL — ABNORMAL HIGH (ref 13.0–17.0)
MCH: 29 pg (ref 26.0–34.0)
MCHC: 32.5 g/dL (ref 30.0–36.0)
MCV: 89.3 fL (ref 80.0–100.0)
Platelets: 149 10*3/uL — ABNORMAL LOW (ref 150–400)
RBC: 5.96 MIL/uL — ABNORMAL HIGH (ref 4.22–5.81)
RDW: 13.4 % (ref 11.5–15.5)
WBC: 5.6 10*3/uL (ref 4.0–10.5)
nRBC: 0 % (ref 0.0–0.2)

## 2021-07-02 LAB — LIPASE, BLOOD: Lipase: 24 U/L (ref 11–51)

## 2021-07-02 NOTE — ED Triage Notes (Signed)
Reports abd pain x 4 days.  Ambulatory to triage.  Reports lbm this morning and was "runny." Denies fever.  Reports normal voiding patterns.  Resp even and unlabored.  Skin warm and dry.  nad ?

## 2021-07-02 NOTE — ED Notes (Signed)
Reported that pt left VT area and did not return.  ?

## 2021-09-14 ENCOUNTER — Other Ambulatory Visit (HOSPITAL_COMMUNITY): Payer: Self-pay | Admitting: Family Medicine

## 2021-09-14 DIAGNOSIS — R600 Localized edema: Secondary | ICD-10-CM

## 2021-09-26 ENCOUNTER — Emergency Department (HOSPITAL_COMMUNITY)
Admission: EM | Admit: 2021-09-26 | Discharge: 2021-09-26 | Discharge status: Against Medical Advice | Payer: No Typology Code available for payment source | Source: Home / Self Care

## 2021-10-08 ENCOUNTER — Ambulatory Visit (HOSPITAL_COMMUNITY): Admission: RE | Admit: 2021-10-08 | Payer: No Typology Code available for payment source | Source: Ambulatory Visit

## 2021-10-08 ENCOUNTER — Encounter (HOSPITAL_COMMUNITY): Payer: Self-pay

## 2021-10-14 ENCOUNTER — Ambulatory Visit (HOSPITAL_COMMUNITY): Payer: No Typology Code available for payment source | Attending: Family Medicine | Admitting: Physical Therapy

## 2021-10-14 DIAGNOSIS — M79661 Pain in right lower leg: Secondary | ICD-10-CM | POA: Diagnosis not present

## 2021-10-14 NOTE — Therapy (Signed)
[facility] [address] [place], [place], [address] Phone: [phone]   Fax:  [phone]  Wound Care Evaluation  Patient Details  Name: Timothy Barrera MRN: [id number] Date of Birth: [birth year] Referring Provider (PT): Colvin Caroli FNP   Encounter Date: 10/14/2021   PT End of Session - 10/14/21 1656     Number of Visits 1/1   Authorization Type [employer]    PT Start Time 1645    PT Stop Time 1656    PT Time Calculation (min) 11 min    Activity Tolerance Patient tolerated treatment well             Past Medical History:  Diagnosis Date   Speech impediment     Past Surgical History:  Procedure Laterality Date   HERNIA REPAIR      There were no vitals filed for this visit.     [facility] PT Assessment - 10/14/21 0001       Assessment   Medical Diagnosis Non healing right leg ulcer    Referring Provider (PT) Colvin Caroli FNP    Prior Therapy not for this dx      Precautions   Precautions None      Restrictions   Weight Bearing Restrictions No      Balance Screen   Has the patient fallen in the past 6 months Yes   PT has MS had been being seen by therapy but quit coming.     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    Patient and Family Stated Goals wound to quit itching    Prior Treatments self care, antibiotic    Pain Scale 0-10    Pain Score --   no pain just itching   Wound Properties Date First Assessed: 10/14/21 Wound Type: Venous stasis ulcer Location: Leg Location Orientation: Right   Wound Image Images linked: 1    Wound Therapy - Clinical Statement Pt is a 36 yo male who comes to the deparment with a healed wound.  Therapist encouraged pt to cleanse, moisturize and wear compresson as  both of the pt LE are slightly swollen.  PT states he will cleanse and moisturize but does not want to wear compression at this time.    Wound Plan Discharge.                   Objective measurements completed on examination: See above findings.             PT Education - 10/14/21 1703     Education Details To cleanse, moisturize and use compression on B LE daily    Person(s) Educated Patient    Methods Explanation    Comprehension Verbalized understanding              PT Short Term Goals - 10/14/21 1700       PT SHORT TERM GOAL #1   Title PT to understand that to reduce risk of wound returning he should cleans, moisturize and wear compression daily.    Time 1    Period Days    Status New    Target Date 10/14/21  Plan - 10/14/21 1100     Clinical Impression Statement Mr. Burling is a 36 yo male who has been referred to skilled therapy for wound care for a right lower extremity wound that is currently healed. He was educated on skin care but does not need any skilled care at this time.     Personal Factors and Comorbidities Comorbidity 2;Fitness    Comorbidities MD, falls    Examination-Activity Limitations --    Stability/Clinical Decision Making Evolving/Moderate complexity    Clinical Decision Making Low    Rehab Potential Excellent    PT Frequency 1x / week    PT Duration --   1 week   PT Treatment/Interventions Patient/family education   cleansing, debridement and dressing change to Rt LE wound to promote healing.   PT Next Visit Plan Discharge no skilled care needed at this time.    PT Home Exercise Plan n/a             Patient will benefit from skilled therapeutic intervention in order to improve the following deficits and impairments:  Decreased skin integrity, Pain  Visit Diagnosis: Pain in right lower leg    Problem List There are no problems to display for this patient. Virgina Organ, PT  CLT [phone]  10/14/2021, 5:04 PM  [facility] [address] [place], [place], [address] Phone: [phone]   Fax:  [phone]  Name: Timothy Barrera MRN: [id number] Date of Birth: 03/11/1986

## 2021-10-15 ENCOUNTER — Ambulatory Visit (HOSPITAL_COMMUNITY)
Admission: RE | Admit: 2021-10-15 | Discharge: 2021-10-15 | Disposition: A | Discharge status: Home / Self Care | Payer: No Typology Code available for payment source | Source: Ambulatory Visit | Attending: Family Medicine | Admitting: Family Medicine

## 2021-10-15 DIAGNOSIS — R600 Localized edema: Secondary | ICD-10-CM | POA: Diagnosis present

## 2021-10-19 ENCOUNTER — Ambulatory Visit (HOSPITAL_COMMUNITY): Payer: No Typology Code available for payment source | Admitting: Physical Therapy

## 2021-10-22 ENCOUNTER — Ambulatory Visit (HOSPITAL_COMMUNITY): Payer: No Typology Code available for payment source

## 2021-11-03 ENCOUNTER — Ambulatory Visit (HOSPITAL_COMMUNITY): Payer: No Typology Code available for payment source

## 2021-11-04 ENCOUNTER — Ambulatory Visit (HOSPITAL_COMMUNITY): Payer: No Typology Code available for payment source | Admitting: Physical Therapy

## 2021-11-06 ENCOUNTER — Ambulatory Visit (HOSPITAL_COMMUNITY): Payer: No Typology Code available for payment source

## 2021-11-10 ENCOUNTER — Ambulatory Visit (HOSPITAL_COMMUNITY): Payer: No Typology Code available for payment source | Admitting: Physical Therapy

## 2021-11-13 ENCOUNTER — Ambulatory Visit (HOSPITAL_COMMUNITY): Payer: No Typology Code available for payment source | Admitting: Physical Therapy

## 2021-11-16 ENCOUNTER — Ambulatory Visit (HOSPITAL_COMMUNITY): Payer: No Typology Code available for payment source | Admitting: Physical Therapy

## 2021-11-19 ENCOUNTER — Ambulatory Visit (HOSPITAL_COMMUNITY): Payer: No Typology Code available for payment source

## 2021-11-23 ENCOUNTER — Ambulatory Visit (HOSPITAL_COMMUNITY): Payer: No Typology Code available for payment source | Admitting: Physical Therapy

## 2021-11-26 ENCOUNTER — Ambulatory Visit (HOSPITAL_COMMUNITY): Payer: No Typology Code available for payment source

## 2021-11-30 ENCOUNTER — Ambulatory Visit (HOSPITAL_COMMUNITY): Payer: No Typology Code available for payment source | Admitting: Physical Therapy

## 2021-12-03 ENCOUNTER — Ambulatory Visit (HOSPITAL_COMMUNITY): Payer: No Typology Code available for payment source | Admitting: Physical Therapy

## 2021-12-04 NOTE — Progress Notes (Deleted)
   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  UPDATE: Last seen in initial consultation on 10/09/2019.  CK and aldolase at that time was 132 and 7.9 respectively.  NCV-EMG on 01/01/2020 revealed distal predominant myopathy with myotonic discharges suggestive of myotonic dystrophy but also demonstrated findings for a sensorimotor axonal polyneuropathy.  Neuropathy labs performed on 01/08/2020 included negative ANA, ACE 18, SSA/SSB antibodies negative, B6 3.9, and B12 272.  Advised to start B12 500 mcg daily.  SPEP/IFE was ordered but appears to not have been collected.    He was lost to follow-up.  ***   HISTORY: He started having problems with bilateral leg weakness after his father passed away 2 years ago.  Sometimes when he walks, he notes sharp pain in the right knee.  He notes swelling in the legs.  He notes some tightness in the back of his right knee.  He has fallen.  No back pain, radicular pain in the legs or numbness.  Reports some difficulty swallowing pills.  Has trouble opening jars.  Sometimes his hands get stuck.  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   Arterial ABI in October was negative.    Family history of muscular dystrophy:  Maternal grandfather, brother (myotonic dystrophy), first cousin on mother's side  PAST MEDICAL HISTORY: Past Medical History:  Diagnosis Date   Speech impediment     MEDICATIONS: Current Outpatient Medications on File Prior to Visit  Medication Sig Dispense Refill   acetaminophen (TYLENOL CHILDRENS PAIN + FEVER) 160 MG/5ML suspension Take 20 mLs (640 mg  total) by mouth every 6 (six) hours as needed for moderate pain. 500 mL 0   ondansetron (ZOFRAN-ODT) 8 MG disintegrating tablet Take 1 tablet (8 mg total) by mouth every 8 (eight) hours as needed for nausea or vomiting. 20 tablet 0   traMADol (ULTRAM) 50 MG tablet Take 50 mg by mouth every 6 (six) hours as needed.     varenicline (CHANTIX) 0.5 MG tablet Take by mouth.     No current facility-administered medications on file prior to visit.    ALLERGIES: No Known Allergies  FAMILY HISTORY: No family history on file.    Objective:  *** General: No acute distress.  Patient appears ***-groomed.   Head:  Normocephalic/atraumatic Eyes:  Fundi examined but not visualized Neck: supple, no paraspinal tenderness, full range of motion Heart:  Regular rate and rhythm Lungs:  Clear to auscultation bilaterally Back: No paraspinal tenderness Neurological Exam: alert and oriented to person, place, and time.  Speech fluent and not dysarthric, language intact.  CN II-XII intact. Bulk and tone normal, muscle strength 5/5 throughout.  Sensation to light touch intact.  Deep tendon reflexes 2+ throughout, toes downgoing.  Finger to nose testing intact.  Gait normal, Romberg negative.   Shon Millet, DO  CC: ***

## 2021-12-08 ENCOUNTER — Encounter: Payer: Self-pay | Admitting: Neurology

## 2021-12-08 ENCOUNTER — Ambulatory Visit: Payer: No Typology Code available for payment source | Admitting: Neurology

## 2021-12-08 DIAGNOSIS — Z029 Encounter for administrative examinations, unspecified: Secondary | ICD-10-CM

## 2022-01-20 NOTE — Progress Notes (Deleted)
   NEUROLOGY FOLLOW UP OFFICE NOTE  Sims Laday [id number]  Assessment/Plan:   Myotonic dystrophy  Cardiology Ophthalmology  Subjective:  Markeith Jue is a 36 year old right-handed male who follows up for myotonic dystrophy.  UPDATE: Last seen in initial consultation on 10/09/2019.  CK and aldolase at that time were normal (132 and 7.9 respectively).  NCV-EMG on 01/01/2020 demonstrated distal predominant myopathy with myotonic discharges suggestive of myotonic dystrophy as well as evidence of a sensorimotor axonal polyneuropathy.  Neuropathy labs on 01/08/2020 revealed negative ANA, ACE 18, negative SSA/SSB antibodies, B6 3.9 and B12 272.  He was advised to start B12 500 mcg daily.  IFE was ordered but never performed.  Since then, ***   HISTORY: He started having problems with bilateral leg weakness after his father passed away 2 years ago.  Sometimes when he walks, he notes sharp pain in the right knee.  He notes swelling in the legs.  He notes some tightness in the back of his right knee.  He has fallen.  No back pain, radicular pain in the legs or numbness.  Reports some difficulty swallowing pills.  Has trouble opening jars.  Sometimes his hands get stuck.  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   Arterial ABI in October was negative.    Family history of muscular dystrophy:  Maternal grandfather, brother (myotonic dystrophy), first cousin on mother's side  PAST MEDICAL HISTORY: Past Medical History:  Diagnosis Date   Speech impediment     MEDICATIONS: Current Outpatient Medications on File Prior to Visit  Medication Sig Dispense Refill   acetaminophen (TYLENOL CHILDRENS PAIN + FEVER) 160 MG/5ML suspension Take 20 mLs (640 mg total) by mouth every 6 (six) hours as needed for moderate pain. 500 mL 0   ondansetron (ZOFRAN-ODT) 8 MG disintegrating tablet Take 1 tablet (8 mg total) by mouth every 8 (eight) hours as needed for nausea or  vomiting. 20 tablet 0   traMADol (ULTRAM) 50 MG tablet Take 50 mg by mouth every 6 (six) hours as needed.     varenicline (CHANTIX) 0.5 MG tablet Take by mouth.     No current facility-administered medications on file prior to visit.    ALLERGIES: No Known Allergies  FAMILY HISTORY: No family history on file.    Objective:  *** General: No acute distress.  Patient appears ***-groomed.   Head:  Normocephalic/atraumatic Eyes:  Fundi examined but not visualized Neck: supple, no paraspinal tenderness, full range of motion Heart:  Regular rate and rhythm Lungs:  Clear to auscultation bilaterally Back: No paraspinal tenderness Neurological Exam: alert and oriented to person, place, and time.  Speech fluent and not dysarthric, language intact.  CN II-XII intact. Bulk and tone normal, muscle strength 5/5 throughout.  Sensation to light touch intact.  Deep tendon reflexes 2+ throughout, toes downgoing.  Finger to nose testing intact.  Gait normal, Romberg negative.   Metta Clines, DO  CC: ***

## 2022-01-25 ENCOUNTER — Encounter: Payer: Self-pay | Admitting: Neurology

## 2022-01-25 ENCOUNTER — Ambulatory Visit: Payer: No Typology Code available for payment source | Admitting: Neurology

## 2022-01-25 DIAGNOSIS — Z029 Encounter for administrative examinations, unspecified: Secondary | ICD-10-CM

## 2022-02-01 ENCOUNTER — Telehealth: Payer: Self-pay | Admitting: Neurology

## 2022-02-01 NOTE — Telephone Encounter (Signed)
Patient dismissed from [facility] by Metta Clines, DO, effective 01/25/22. Dismissal Letter sent out by 1st class mail. [employer]

## 2022-02-16 ENCOUNTER — Encounter (INDEPENDENT_AMBULATORY_CARE_PROVIDER_SITE_OTHER): Payer: Self-pay | Admitting: *Deleted

## 2022-02-16 ENCOUNTER — Encounter: Payer: Self-pay | Admitting: *Deleted

## 2022-03-22 NOTE — Progress Notes (Deleted)
GI Office Note    Referring Provider: [facility], [facility] Aschoff* Primary Care Physician:  Shelby Dubin, FNP  Primary Gastroenterologist: ***  Chief Complaint   No chief complaint on file.    History of Present Illness   Timothy Barrera is a 36 y.o. male presenting today at the request of [facility], [place]* for ***      Current Outpatient Medications  Medication Sig Dispense Refill   acetaminophen (TYLENOL CHILDRENS PAIN + FEVER) 160 MG/5ML suspension Take 20 mLs (640 mg total) by mouth every 6 (six) hours as needed for moderate pain. 500 mL 0   ondansetron (ZOFRAN-ODT) 8 MG disintegrating tablet Take 1 tablet (8 mg total) by mouth every 8 (eight) hours as needed for nausea or vomiting. 20 tablet 0   traMADol (ULTRAM) 50 MG tablet Take 50 mg by mouth every 6 (six) hours as needed.     varenicline (CHANTIX) 0.5 MG tablet Take by mouth.     No current facility-administered medications for this visit.    Past Medical History:  Diagnosis Date   Speech impediment     Past Surgical History:  Procedure Laterality Date   HERNIA REPAIR      No family history on file.  Allergies as of 03/23/2022   (No Known Allergies)    Social History   Socioeconomic History   Marital status: Single    Spouse name: Not on file   Number of children: Not on file   Years of education: Not on file   Highest education level: Not on file  Occupational History   Not on file  Tobacco Use   Smoking status: Every Day    Packs/day: 1.00    Types: Cigarettes   Smokeless tobacco: Never  Vaping Use   Vaping Use: Never used  Substance and Sexual Activity   Alcohol use: Yes    Comment: socially   Drug use: No   Sexual activity: Yes    Birth control/protection: None  Other Topics Concern   Not on file  Social History Narrative   Right handed   Lives lives with brother one story home   Social Determinants of Health   Financial Resource Strain: Not on file  Food  Insecurity: Not on file  Transportation Needs: Not on file  Physical Activity: Not on file  Stress: Not on file  Social Connections: Not on file  Intimate Partner Violence: Not on file     Review of Systems   Gen: Denies any fever, chills, fatigue, weight loss, lack of appetite.  CV: Denies chest pain, heart palpitations, peripheral edema, syncope.  Resp: Denies shortness of breath at rest or with exertion. Denies wheezing or cough.  GI: see HPI GU : Denies urinary burning, urinary frequency, urinary hesitancy MS: Denies joint pain, muscle weakness, cramps, or limitation of movement.  Derm: Denies rash, itching, dry skin Psych: Denies depression, anxiety, memory loss, and confusion Heme: Denies bruising, bleeding, and enlarged lymph nodes.   Physical Exam   There were no vitals taken for this visit.  General:   Alert and oriented. Pleasant and cooperative. Well-nourished and well-developed.  Head:  Normocephalic and atraumatic. Eyes:  Without icterus, sclera clear and conjunctiva pink.  Ears:  Normal auditory acuity. Mouth:  No deformity or lesions, oral mucosa pink.  Lungs:  Clear to auscultation bilaterally. No wheezes, rales, or rhonchi. No distress.  Heart:  S1, S2 present without murmurs appreciated.  Abdomen:  +BS, soft, non-tender and non-distended. No  HSM noted. No guarding or rebound. No masses appreciated.  Rectal:  Deferred  Msk:  Symmetrical without gross deformities. Normal posture. Extremities:  Without edema. Neurologic:  Alert and  oriented x4;  grossly normal neurologically. Skin:  Intact without significant lesions or rashes. Psych:  Alert and cooperative. Normal mood and affect.   Assessment   Timothy Barrera is a 36 y.o. male with a history of muscular dystrophy, previous episode of cellulitis*** presenting today for evaluation of dysphagia.  Dysphagia: Muscular dystrophy likely etiology of dysphagia.***   PLAN   *** BPE vs MBSS vs  EGD    Brooke Bonito, MSN, FNP-BC, AGACNP-BC [facility]

## 2022-03-23 ENCOUNTER — Ambulatory Visit: Payer: No Typology Code available for payment source | Admitting: Gastroenterology

## 2022-05-12 NOTE — Progress Notes (Deleted)
GI Office Note    Referring Provider: Olga Coaster, FNP Primary Care Physician:  Olga Coaster, FNP  Primary Gastroenterologist: ***  Chief Complaint   No chief complaint on file.   History of Present Illness   Timothy Barrera is a 37 y.o. male presenting today at the request of Bucio, [place], FNP for ***dysphagia.   ***   Current Outpatient Medications  Medication Sig Dispense Refill   acetaminophen (TYLENOL CHILDRENS PAIN + FEVER) 160 MG/5ML suspension Take 20 mLs (640 mg total) by mouth every 6 (six) hours as needed for moderate pain. 500 mL 0   ondansetron (ZOFRAN-ODT) 8 MG disintegrating tablet Take 1 tablet (8 mg total) by mouth every 8 (eight) hours as needed for nausea or vomiting. 20 tablet 0   traMADol (ULTRAM) 50 MG tablet Take 50 mg by mouth every 6 (six) hours as needed.     varenicline (CHANTIX) 0.5 MG tablet Take by mouth.     No current facility-administered medications for this visit.    Past Medical History:  Diagnosis Date   Speech impediment     Past Surgical History:  Procedure Laterality Date   HERNIA REPAIR      No family history on file.  Allergies as of 05/13/2022   (No Known Allergies)    Social History   Socioeconomic History   Marital status: Single    Spouse name: Not on file   Number of children: Not on file   Years of education: Not on file   Highest education level: Not on file  Occupational History   Not on file  Tobacco Use   Smoking status: Every Day    Packs/day: 1.00    Types: Cigarettes   Smokeless tobacco: Never  Vaping Use   Vaping Use: Never used  Substance and Sexual Activity   Alcohol use: Yes    Comment: socially   Drug use: No   Sexual activity: Yes    Birth control/protection: None  Other Topics Concern   Not on file  Social History Narrative   Right handed   Lives lives with brother one story home   Social Determinants of Health   Financial Resource Strain: Not on file  Food  Insecurity: Not on file  Transportation Needs: Not on file  Physical Activity: Not on file  Stress: Not on file  Social Connections: Not on file  Intimate Partner Violence: Not on file     Review of Systems   Gen: Denies any fever, chills, fatigue, weight loss, lack of appetite.  CV: Denies chest pain, heart palpitations, peripheral edema, syncope.  Resp: Denies shortness of breath at rest or with exertion. Denies wheezing or cough.  GI: see HPI GU : Denies urinary burning, urinary frequency, urinary hesitancy MS: Denies joint pain, muscle weakness, cramps, or limitation of movement.  Derm: Denies rash, itching, dry skin Psych: Denies depression, anxiety, memory loss, and confusion Heme: Denies bruising, bleeding, and enlarged lymph nodes.   Physical Exam   There were no vitals taken for this visit.  General:   Alert and oriented. Pleasant and cooperative. Well-nourished and well-developed.  Head:  Normocephalic and atraumatic. Eyes:  Without icterus, sclera clear and conjunctiva pink.  Ears:  Normal auditory acuity. Mouth:  No deformity or lesions, oral mucosa pink.  Lungs:  Clear to auscultation bilaterally. No wheezes, rales, or rhonchi. No distress.  Heart:  S1, S2 present without murmurs appreciated.  Abdomen:  +BS, soft, non-tender and non-distended.  No HSM noted. No guarding or rebound. No masses appreciated.  Rectal:  Deferred  Msk:  Symmetrical without gross deformities. Normal posture. Extremities:  Without edema. Neurologic:  Alert and  oriented x4;  grossly normal neurologically. Skin:  Intact without significant lesions or rashes. Psych:  Alert and cooperative. Normal mood and affect.   Assessment   Timothy Barrera is a 37 y.o. male with a history of muscular dystrophy, previous episode of cellulitis*** presenting today for evaluation of dysphagia.   Dysphagia: Muscular dystrophy likely etiology of dysphagia.***   PLAN   *** BPE vs MBSS vs  EGD   Venetia Night, MSN, FNP-BC, AGACNP-BC [facility]

## 2022-05-13 ENCOUNTER — Ambulatory Visit: Payer: No Typology Code available for payment source | Admitting: Gastroenterology

## 2022-05-14 ENCOUNTER — Encounter: Payer: Self-pay | Admitting: Gastroenterology

## 2022-10-14 ENCOUNTER — Other Ambulatory Visit (HOSPITAL_COMMUNITY): Payer: Self-pay | Admitting: Family Medicine

## 2022-10-14 ENCOUNTER — Ambulatory Visit (HOSPITAL_COMMUNITY)
Admission: RE | Admit: 2022-10-14 | Discharge: 2022-10-14 | Disposition: A | Discharge status: Home / Self Care | Payer: Medicaid Other | Source: Ambulatory Visit | Attending: Family Medicine | Admitting: Family Medicine

## 2022-10-14 DIAGNOSIS — M25562 Pain in left knee: Secondary | ICD-10-CM

## 2022-12-14 ENCOUNTER — Other Ambulatory Visit (HOSPITAL_COMMUNITY): Payer: Self-pay | Admitting: Family Medicine

## 2022-12-14 DIAGNOSIS — M7989 Other specified soft tissue disorders: Secondary | ICD-10-CM

## 2022-12-20 ENCOUNTER — Ambulatory Visit (HOSPITAL_COMMUNITY): Admission: RE | Admit: 2022-12-20 | Payer: Medicaid Other | Source: Ambulatory Visit

## 2022-12-28 ENCOUNTER — Ambulatory Visit (HOSPITAL_COMMUNITY)
Admission: RE | Admit: 2022-12-28 | Discharge: 2022-12-28 | Disposition: A | Discharge status: Home / Self Care | Payer: Medicaid Other | Source: Ambulatory Visit | Attending: Family Medicine | Admitting: Family Medicine

## 2022-12-28 DIAGNOSIS — M7989 Other specified soft tissue disorders: Secondary | ICD-10-CM | POA: Insufficient documentation

## 2023-09-03 ENCOUNTER — Other Ambulatory Visit: Payer: Self-pay

## 2023-09-03 ENCOUNTER — Emergency Department (HOSPITAL_COMMUNITY)

## 2023-09-03 ENCOUNTER — Emergency Department (HOSPITAL_COMMUNITY)
Admission: EM | Admit: 2023-09-03 | Discharge: 2023-09-03 | Disposition: A | Discharge status: Against Medical Advice | Attending: Emergency Medicine | Admitting: Emergency Medicine

## 2023-09-03 ENCOUNTER — Encounter (HOSPITAL_COMMUNITY): Payer: Self-pay

## 2023-09-03 DIAGNOSIS — Z5321 Procedure and treatment not carried out due to patient leaving prior to being seen by health care provider: Secondary | ICD-10-CM | POA: Diagnosis not present

## 2023-09-03 DIAGNOSIS — S0181XA Laceration without foreign body of other part of head, initial encounter: Secondary | ICD-10-CM | POA: Diagnosis not present

## 2023-09-03 DIAGNOSIS — S0990XA Unspecified injury of head, initial encounter: Secondary | ICD-10-CM | POA: Diagnosis present

## 2023-09-03 DIAGNOSIS — W138XXA Fall from, out of or through other building or structure, initial encounter: Secondary | ICD-10-CM | POA: Diagnosis not present

## 2023-09-03 NOTE — ED Triage Notes (Signed)
 Pt reports he fell into a wall tonight and has a laceration to the back of his head.

## 2023-09-04 ENCOUNTER — Emergency Department (HOSPITAL_COMMUNITY)

## 2023-09-04 ENCOUNTER — Emergency Department (HOSPITAL_COMMUNITY)
Admission: EM | Admit: 2023-09-04 | Discharge: 2023-09-04 | Disposition: A | Discharge status: Home / Self Care | Attending: Emergency Medicine | Admitting: Emergency Medicine

## 2023-09-04 ENCOUNTER — Encounter (HOSPITAL_COMMUNITY): Payer: Self-pay | Admitting: Emergency Medicine

## 2023-09-04 ENCOUNTER — Other Ambulatory Visit: Payer: Self-pay

## 2023-09-04 DIAGNOSIS — M549 Dorsalgia, unspecified: Secondary | ICD-10-CM | POA: Diagnosis not present

## 2023-09-04 DIAGNOSIS — S82092A Other fracture of left patella, initial encounter for closed fracture: Secondary | ICD-10-CM | POA: Insufficient documentation

## 2023-09-04 DIAGNOSIS — W01198A Fall on same level from slipping, tripping and stumbling with subsequent striking against other object, initial encounter: Secondary | ICD-10-CM | POA: Insufficient documentation

## 2023-09-04 DIAGNOSIS — S8992XA Unspecified injury of left lower leg, initial encounter: Secondary | ICD-10-CM | POA: Diagnosis present

## 2023-09-04 DIAGNOSIS — M79672 Pain in left foot: Secondary | ICD-10-CM | POA: Insufficient documentation

## 2023-09-04 DIAGNOSIS — M25572 Pain in left ankle and joints of left foot: Secondary | ICD-10-CM | POA: Diagnosis not present

## 2023-09-04 DIAGNOSIS — S0101XA Laceration without foreign body of scalp, initial encounter: Secondary | ICD-10-CM | POA: Insufficient documentation

## 2023-09-04 DIAGNOSIS — Y92810 Car as the place of occurrence of the external cause: Secondary | ICD-10-CM | POA: Insufficient documentation

## 2023-09-04 DIAGNOSIS — M542 Cervicalgia: Secondary | ICD-10-CM | POA: Insufficient documentation

## 2023-09-04 LAB — CBC WITH DIFFERENTIAL/PLATELET
Abs Immature Granulocytes: 0.04 10*3/uL (ref 0.00–0.07)
Basophils Absolute: 0 10*3/uL (ref 0.0–0.1)
Basophils Relative: 0 %
Eosinophils Absolute: 0.1 10*3/uL (ref 0.0–0.5)
Eosinophils Relative: 1 %
HCT: 50.4 % (ref 39.0–52.0)
Hemoglobin: 16 g/dL (ref 13.0–17.0)
Immature Granulocytes: 1 %
Lymphocytes Relative: 8 %
Lymphs Abs: 0.6 10*3/uL — ABNORMAL LOW (ref 0.7–4.0)
MCH: 28.9 pg (ref 26.0–34.0)
MCHC: 31.7 g/dL (ref 30.0–36.0)
MCV: 91.1 fL (ref 80.0–100.0)
Monocytes Absolute: 0.4 10*3/uL (ref 0.1–1.0)
Monocytes Relative: 5 %
Neutro Abs: 6.7 10*3/uL (ref 1.7–7.7)
Neutrophils Relative %: 85 %
Platelets: 144 10*3/uL — ABNORMAL LOW (ref 150–400)
RBC: 5.53 MIL/uL (ref 4.22–5.81)
RDW: 13.2 % (ref 11.5–15.5)
WBC: 7.9 10*3/uL (ref 4.0–10.5)
nRBC: 0 % (ref 0.0–0.2)

## 2023-09-04 LAB — BASIC METABOLIC PANEL WITH GFR
Anion gap: 9 (ref 5–15)
BUN: 8 mg/dL (ref 6–20)
CO2: 26 mmol/L (ref 22–32)
Calcium: 9 mg/dL (ref 8.9–10.3)
Chloride: 112 mmol/L — ABNORMAL HIGH (ref 98–111)
Creatinine, Ser: 0.43 mg/dL — ABNORMAL LOW (ref 0.61–1.24)
GFR, Estimated: >60 mL/min (ref >60)
Glucose, Bld: 97 mg/dL (ref 70–99)
Potassium: 4.1 mmol/L (ref 3.5–5.1)
Sodium: 147 mmol/L — ABNORMAL HIGH (ref 135–145)

## 2023-09-04 MED ORDER — HYDROCODONE-ACETAMINOPHEN 7.5-325 MG/15ML PO SOLN: 10.0000 mL | ORAL | 0 refills | Status: AC | PRN

## 2023-09-04 MED ORDER — OXYCODONE-ACETAMINOPHEN 5-325 MG PO TABS: 1.0000 | ORAL_TABLET | Freq: Once | ORAL | Status: DC

## 2023-09-04 MED ORDER — LIDOCAINE-EPINEPHRINE (PF) 2 %-1:200000 IJ SOLN
10.0000 mL | Freq: Once | INTRAMUSCULAR | Status: AC
  Administered 2023-09-04: 10 mL
  Filled 2023-09-04: qty 20

## 2023-09-04 MED ORDER — MORPHINE SULFATE (PF) 4 MG/ML IV SOLN
4.0000 mg | Freq: Once | INTRAVENOUS | Status: AC
  Administered 2023-09-04: 4 mg via INTRAVENOUS
  Filled 2023-09-04: qty 1

## 2023-09-04 MED ORDER — HYDROCODONE-ACETAMINOPHEN 7.5-325 MG/15ML PO SOLN
10.0000 mL | Freq: Once | ORAL | Status: AC
  Administered 2023-09-04: 10 mL via ORAL
  Filled 2023-09-04: qty 15

## 2023-09-04 MED ORDER — ONDANSETRON HCL 4 MG/2ML IJ SOLN
4.0000 mg | Freq: Once | INTRAMUSCULAR | Status: AC
  Administered 2023-09-04: 4 mg via INTRAVENOUS
  Filled 2023-09-04: qty 2

## 2023-09-04 NOTE — ED Triage Notes (Addendum)
 Patient here BIB [facility] after having 2 falls tonight. Presented here earlier tonight after the first one but LWBS. Suffered a laceration from the first one. Patient fell while exiting the car, hitting his head on concrete creating another laceration. Patient stating his left knee gave out on him causing the fall. The fall was witnessed by patient's brother. No LOC,  no blood thinners. Patient w/ hx of muscular dystrophy. C/o kneck, back, L knee pain, left foot pain. Swelling noted to the L knee.

## 2023-09-04 NOTE — ED Provider Notes (Signed)
 [place] [facility] DE[facility]MENT AT [facility] Provider Note   CSN: [id number] Arrival date & time: 09/04/23  0045     History  Chief Complaint  Patient presents with   Timothy Barrera is a 38 y.o. male.  Presents to the emergency department for evaluation after a fall.  Patient has fallen twice tonight.  He has a history of muscular dystrophy.  Patient fell getting out of the car hitting his head on concrete.  He has a laceration to the back of the head.  He is complaining of headache, neck pain, back pain, left knee pain, left foot and ankle pain.  No loss of consciousness.       Home Medications Prior to Admission medications   Medication Sig Start Date End Date Taking? Authorizing Provider  HYDROcodone-acetaminophen  (HYCET) 7.5-325 mg/15 ml solution Take 10 mLs by mouth every 4 (four) hours as needed for moderate pain (pain score 4-6). 09/04/23 09/03/24 Yes Deberah Adolf, Marine Sia, MD  acetaminophen  (TYLENOL  CHILDRENS PAIN + FEVER) 160 MG/5ML suspension Take 20 mLs (640 mg total) by mouth every 6 (six) hours as needed for moderate pain. 06/30/21   Adolph Hoop, PA-C  ondansetron  (ZOFRAN -ODT) 8 MG disintegrating tablet Take 1 tablet (8 mg total) by mouth every 8 (eight) hours as needed for nausea or vomiting. 06/30/21   Adolph Hoop, PA-C  traMADol (ULTRAM) 50 MG tablet Take 50 mg by mouth every 6 (six) hours as needed. 01/07/21   [provider]  varenicline (CHANTIX) 0.5 MG tablet Take by mouth. 11/17/20   [provider]      Allergies    Patient has no known allergies.    Review of Systems   Review of Systems  Physical Exam Updated Vital Signs BP 112/73   Pulse 60   Temp 98.4 F (36.9 C) (Oral)   Resp 18   Ht 6\' 1"  (1.854 m)   Wt 107 kg   SpO2 92%   BMI 31.12 kg/m  Physical Exam Vitals and nursing note reviewed.  Constitutional:      General: He is not in acute distress.    Appearance: He is well-developed.  HENT:     Head:  Normocephalic. Abrasion and laceration present.      Mouth/Throat:     Mouth: Mucous membranes are moist.  Eyes:     General: Vision grossly intact. Gaze aligned appropriately.     Extraocular Movements: Extraocular movements intact.     Conjunctiva/sclera: Conjunctivae normal.  Cardiovascular:     Rate and Rhythm: Normal rate and regular rhythm.     Pulses: Normal pulses.     Heart sounds: Normal heart sounds, S1 normal and S2 normal. No murmur heard.    No friction rub. No gallop.  Pulmonary:     Effort: Pulmonary effort is normal. No respiratory distress.     Breath sounds: Normal breath sounds.  Abdominal:     Palpations: Abdomen is soft.     Tenderness: There is no abdominal tenderness. There is no guarding or rebound.     Hernia: No hernia is present.  Musculoskeletal:        General: No swelling.     Cervical back: Full passive range of motion without pain, normal range of motion and neck supple. No pain with movement, spinous process tenderness or muscular tenderness. Normal range of motion.     Left knee: Swelling present. No deformity, erythema or ecchymosis. Tenderness present.     Right  lower leg: No edema.     Left lower leg: No edema.  Skin:    General: Skin is warm and dry.     Capillary Refill: Capillary refill takes less than 2 seconds.     Findings: No ecchymosis, erythema, lesion or wound.  Neurological:     Mental Status: He is alert and oriented to person, place, and time.     GCS: GCS eye subscore is 4. GCS verbal subscore is 5. GCS motor subscore is 6.     Cranial Nerves: Cranial nerves 2-12 are intact.     Sensory: Sensation is intact.     Motor: Motor function is intact. No weakness or abnormal muscle tone.     Coordination: Coordination is intact.  Psychiatric:        Mood and Affect: Mood normal.        Speech: Speech normal.        Behavior: Behavior normal.     ED Results / Procedures / Treatments   Labs (all labs ordered are listed, but only  abnormal results are displayed) Labs Reviewed  CBC WITH DIFFERENTIAL/PLATELET - Abnormal; Notable for the following components:      Result Value   Platelets 144 (*)    Lymphs Abs 0.6 (*)    All other components within normal limits  BASIC METABOLIC PANEL WITH GFR - Abnormal; Notable for the following components:   Sodium 147 (*)    Chloride 112 (*)    Creatinine, Ser 0.43 (*)    All other components within normal limits  URINALYSIS, ROUTINE W REFLEX MICROSCOPIC    EKG None  Radiology DG Foot Complete Left Result Date: 09/04/2023 EXAM: 3 OR MORE VIEW(S) XRAY OF THE LEFT FOOT 09/04/2023 02:44:00 AM COMPARISON: None available. CLINICAL HISTORY: Fall. Patient here BIB [facility] after having 2 falls tonight. Presented here earlier tonight after the first one but LWBS. Suffered a laceration from the first one. Patient fell while exiting the car, hitting his head on concrete creating another laceration. Patient stating his left knee gave out on him causing the fall. The fall was witnessed by patient's brother. No LOC, no blood thinners. Patient with history of muscular dystrophy. Complains of neck, back, left knee pain, left foot pain. Swelling noted to the left knee. FINDINGS: BONES AND JOINTS: No acute fracture. No focal osseous lesion. No joint dislocation. SOFT TISSUES: The soft tissues are unremarkable. IMPRESSION: 1. No acute abnormality. Electronically signed by: Zadie Herter MD 09/04/2023 02:52 AM EDT RP Workstation: [id number]   DG Ankle Complete Left Result Date: 09/04/2023 EXAM: 3 or more VIEW(S) XRAY OF THE LEFT ANKLE 09/04/2023 02:44:00 AM CLINICAL HISTORY: Fall. Patient here BIB [facility] after having 2 falls tonight. Presented here earlier tonight after the first one but LWBS. Suffered a laceration from the first one. Patient fell while exiting the car, hitting his head on concrete creating another laceration. Patient stating his left knee gave out on him causing the fall. The fall was  witnessed by patient's brother. No LOC, no blood thinners. Patient w/ hx of muscular dystrophy. C/o kneck, back, L knee pain, left foot pain. Swelling noted to the L knee. COMPARISON: None available. FINDINGS: BONES AND JOINTS: No acute fracture is seen. Posttraumatic deformity of the inferior fibula. SOFT TISSUES: The soft tissues are unremarkable. IMPRESSION: 1. No acute osseous abnormality. 2. Posttraumatic deformity of the inferior fibula. Electronically signed by: Zadie Herter MD 09/04/2023 02:51 AM EDT RP Workstation: [id number]   DG Knee Complete 4  Views Left Result Date: 09/04/2023 EXAM: 4 VIEW(S) XRAY OF THE LEFT KNEE 09/04/2023 02:44:00 AM COMPARISON: None available. CLINICAL HISTORY: Fall. Patient here BIB [facility] after having 2 falls tonight. Presented here earlier tonight after the first one but LWBS. Suffered a laceration from the first one. Patient fell while exiting the car, hitting his head on concrete creating another laceration. Patient stating his left knee gave out on him causing the fall. The fall was witnessed by patient's brother. No LOC, no blood thinners. Patient w/ hx of muscular dystrophy. C/o kneck, back, L knee pain, left foot pain. Swelling noted to the L knee. FINDINGS: BONES AND JOINTS: Suspected inferior avulsion fracture involving the patella. Additional irregularity of the patella raises concern for an additional transverse patellar fracture. Small suprapatellar knee joint effusion. SOFT TISSUES: The soft tissues are unremarkable. IMPRESSION: 1. Suspected inferior avulsion fracture involving the patella. Possible additional patellar fracture, equivocal. 2. Small suprapatellar knee joint effusion. Electronically signed by: Zadie Herter MD 09/04/2023 02:50 AM EDT RP Workstation: [id number]   DG Lumbar Spine Complete Result Date: 09/04/2023 EXAM: 4 VIEW(S) XRAY OF THE LUMBAR SPINE 09/04/2023 02:44:00 AM COMPARISON: None available. CLINICAL HISTORY: Fall. Patient here BIB  [facility] after having 2 falls tonight. Presented here earlier tonight after the first one but LWBS. Suffered a laceration from the first one. Patient fell while exiting the car, hitting his head on concrete creating another laceration. Patient stating his left knee gave out on him causing the fall. The fall was witnessed by patient's brother. No LOC, no blood thinners. Patient with history of muscular dystrophy. Complains of neck, back, left knee pain, left foot pain. Swelling noted to the left knee. FINDINGS: BONES: No acute fracture. No aggressive appearing osseous lesion. Alignment is normal. DISCS AND DEGENERATIVE CHANGES: No severe degenerative changes. SOFT TISSUES: No acute abnormality. IMPRESSION: 1. No acute abnormality of the lumbar spine. Electronically signed by: Zadie Herter MD 09/04/2023 02:48 AM EDT RP Workstation: [id number]   CT HEAD WO CONTRAST ( ) Result Date: 09/04/2023 EXAM: CT HEAD AND CERVICAL SPINE 09/04/2023 02:18:25 AM TECHNIQUE: CT of the head and cervical spine was performed without the administration of intravenous contrast. Multiplanar reformatted images are provided for review. Automated exposure control, iterative reconstruction, and/or weight based adjustment of the mA/kV was utilized to reduce the radiation dose to as low as reasonably achievable. COMPARISON: None available. CLINICAL HISTORY: Head trauma, moderate-severe. 2 falls tonight. Presented here earlier tonight after the first one but LWBS. Suffered a laceration from the first one. Patient fell while exiting the car, hitting his head on concrete creating another laceration. Patient stating his left knee gave out on him causing the fall. The fall was witnessed by patient's brother. No LOC, no blood thinners. Patient w/ hx of muscular dystrophy. C/o kneck, back, L knee pain, left foot pain. Swelling noted to the L knee. FINDINGS: HEAD: BRAIN AND VENTRICLES: There is no acute intracranial hemorrhage, mass effect or midline  shift. No abnormal extra-axial fluid collection. The gray-white differentiation is maintained without evidence of an acute infarct. Mild asymmetry of the lateral ventricles, reflecting normal variation. There is no evidence of hydrocephalus. ORBITS: The visualized portion of the orbits demonstrate no acute abnormality. SINUSES: The visualized paranasal sinuses and mastoid air cells demonstrate no acute abnormality. SOFT TISSUES AND SKULL: No acute abnormality of the visualized skull or soft tissues. CERVICAL SPINE: BONES AND ALIGNMENT: There is no acute fracture or traumatic malalignment. DEGENERATIVE CHANGES: No significant degenerative changes. SOFT TISSUES: There is  no prevertebral soft tissue swelling. IMPRESSION: 1. No acute intracranial abnormality. 2. No acute fracture in the cervical spine. Electronically signed by: Zadie Herter MD 09/04/2023 02:25 AM EDT RP Workstation: [id number]   CT CERVICAL SPINE WO CONTRAST Result Date: 09/04/2023 EXAM: CT HEAD AND CERVICAL SPINE 09/04/2023 02:18:25 AM TECHNIQUE: CT of the head and cervical spine was performed without the administration of intravenous contrast. Multiplanar reformatted images are provided for review. Automated exposure control, iterative reconstruction, and/or weight based adjustment of the mA/kV was utilized to reduce the radiation dose to as low as reasonably achievable. COMPARISON: None available. CLINICAL HISTORY: Head trauma, moderate-severe. 2 falls tonight. Presented here earlier tonight after the first one but LWBS. Suffered a laceration from the first one. Patient fell while exiting the car, hitting his head on concrete creating another laceration. Patient stating his left knee gave out on him causing the fall. The fall was witnessed by patient's brother. No LOC, no blood thinners. Patient w/ hx of muscular dystrophy. C/o kneck, back, L knee pain, left foot pain. Swelling noted to the L knee. FINDINGS: HEAD: BRAIN AND VENTRICLES: There is  no acute intracranial hemorrhage, mass effect or midline shift. No abnormal extra-axial fluid collection. The gray-white differentiation is maintained without evidence of an acute infarct. Mild asymmetry of the lateral ventricles, reflecting normal variation. There is no evidence of hydrocephalus. ORBITS: The visualized portion of the orbits demonstrate no acute abnormality. SINUSES: The visualized paranasal sinuses and mastoid air cells demonstrate no acute abnormality. SOFT TISSUES AND SKULL: No acute abnormality of the visualized skull or soft tissues. CERVICAL SPINE: BONES AND ALIGNMENT: There is no acute fracture or traumatic malalignment. DEGENERATIVE CHANGES: No significant degenerative changes. SOFT TISSUES: There is no prevertebral soft tissue swelling. IMPRESSION: 1. No acute intracranial abnormality. 2. No acute fracture in the cervical spine. Electronically signed by: Zadie Herter MD 09/04/2023 02:25 AM EDT RP Workstation: [id number]    Procedures .Laceration Repair  Date/Time: 09/04/2023 6:00 AM  Performed by: Ballard Bongo, MD Authorized by: Ballard Bongo, MD   Consent:    Consent obtained:  Verbal   Consent given by:  Patient   Risks, benefits, and alternatives were discussed: yes     Risks discussed:  Infection, pain, retained foreign body and poor cosmetic result Universal protocol:    Procedure explained and questions answered to patient or proxy's satisfaction: yes     Relevant documents present and verified: yes     Test results available: yes     Imaging studies available: yes     Required blood products, implants, devices, and special equipment available: yes     Site/side marked: yes     Immediately prior to procedure, a time out was called: yes     Patient identity confirmed:  Verbally with patient Anesthesia:    Anesthesia method:  Local infiltration   Local anesthetic:  Lidocaine  2% WITH epi Laceration details:    Location:  Scalp   Scalp  location:  Occipital   Length (cm):  3.5 Pre-procedure details:    Preparation:  Patient was prepped and draped in usual sterile fashion and imaging obtained to evaluate for foreign bodies Exploration:    Hemostasis achieved with:  Epinephrine  Treatment:    Area cleansed with:  Povidone-iodine    Amount of cleaning:  Standard   Irrigation solution:  Sterile saline   Irrigation method:  Syringe   Debridement:  None   Undermining:  None Skin repair:    Repair method:  Staples   Number of staples:  6 Approximation:    Approximation:  Close Repair type:    Repair type:  Simple Post-procedure details:    Dressing:  Open (no dressing)   Procedure completion:  Tolerated well, no immediate complications     Medications Ordered in ED Medications  oxyCODONE -acetaminophen  (PERCOCET/ROXICET) 5-325 MG per tablet 1 tablet (1 tablet Oral Not Given 09/04/23 0539)  morphine (PF) 4 MG/ML injection 4 mg (4 mg Intravenous Given 09/04/23 0322)  ondansetron  (ZOFRAN ) injection 4 mg (4 mg Intravenous Given 09/04/23 0323)  lidocaine -EPINEPHrine  (XYLOCAINE  W/EPI) 2 %-1:200000 (PF) injection 10 mL (10 mLs Infiltration Given 09/04/23 0513)  HYDROcodone-acetaminophen  (HYCET) 7.5-325 mg/15 ml solution 10 mL (10 mLs Oral Given 09/04/23 0542)    ED Course/ Medical Decision Making/ A&P                                 Medical Decision Making Amount and/or Complexity of Data Reviewed Labs: ordered. Radiology: ordered.  Risk Prescription drug management.           Final Clinical Impression(s) / ED Diagnoses Final diagnoses:  Scalp laceration, initial encounter  Other closed fracture of left patella, initial encounter    Rx / DC Orders ED Discharge Orders          Ordered    HYDROcodone-acetaminophen  (HYCET) 7.5-325 mg/15 ml solution  Every 4 hours PRN        09/04/23 0603              Ballard Bongo, MD 09/04/23 [phone]

## 2023-11-18 ENCOUNTER — Other Ambulatory Visit: Payer: Self-pay | Admitting: Orthopedic Surgery

## 2023-11-18 DIAGNOSIS — M25562 Pain in left knee: Secondary | ICD-10-CM

## 2023-11-23 ENCOUNTER — Inpatient Hospital Stay: Admission: RE | Admit: 2023-11-23 | Source: Ambulatory Visit

## 2023-12-02 ENCOUNTER — Other Ambulatory Visit

## 2023-12-12 ENCOUNTER — Ambulatory Visit
Admission: RE | Admit: 2023-12-12 | Discharge: 2023-12-12 | Disposition: A | Discharge status: Home / Self Care | Source: Ambulatory Visit | Attending: Orthopedic Surgery | Admitting: Orthopedic Surgery

## 2023-12-12 DIAGNOSIS — M25562 Pain in left knee: Secondary | ICD-10-CM
